# Patient Record
Sex: Female | Born: 1945 | Race: White | Hispanic: No | Marital: Married | Smoking: Never smoker
Health system: Southern US, Community
[De-identification: ages and names within clinical notes are randomized; demographics above are authoritative.]

## PROBLEM LIST (undated history)

## (undated) DIAGNOSIS — M8580 Other specified disorders of bone density and structure, unspecified site: Secondary | ICD-10-CM

## (undated) DIAGNOSIS — F419 Anxiety disorder, unspecified: Secondary | ICD-10-CM

## (undated) DIAGNOSIS — N814 Uterovaginal prolapse, unspecified: Secondary | ICD-10-CM

## (undated) DIAGNOSIS — J309 Allergic rhinitis, unspecified: Secondary | ICD-10-CM

## (undated) DIAGNOSIS — E039 Hypothyroidism, unspecified: Secondary | ICD-10-CM

## (undated) DIAGNOSIS — M549 Dorsalgia, unspecified: Secondary | ICD-10-CM

## (undated) DIAGNOSIS — I10 Essential (primary) hypertension: Secondary | ICD-10-CM

## (undated) DIAGNOSIS — E7800 Pure hypercholesterolemia, unspecified: Secondary | ICD-10-CM

## (undated) DIAGNOSIS — G8929 Other chronic pain: Secondary | ICD-10-CM

## (undated) HISTORY — DX: Allergic rhinitis, unspecified: J30.9

## (undated) HISTORY — DX: Other specified disorders of bone density and structure, unspecified site: M85.80

## (undated) HISTORY — DX: Dorsalgia, unspecified: M54.9

## (undated) HISTORY — DX: Pure hypercholesterolemia, unspecified: E78.00

## (undated) HISTORY — DX: Hypothyroidism, unspecified: E03.9

## (undated) HISTORY — DX: Uterovaginal prolapse, unspecified: N81.4

## (undated) HISTORY — DX: Essential (primary) hypertension: I10

## (undated) HISTORY — DX: Other chronic pain: G89.29

## (undated) HISTORY — DX: Anxiety disorder, unspecified: F41.9

---
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
## 1970-01-24 HISTORY — PX: TUBAL LIGATION: SHX77

## 1997-06-03 ENCOUNTER — Ambulatory Visit: Admission: RE | Admit: 1997-06-03 | Discharge: 1997-06-03 | Payer: Self-pay | Admitting: Obstetrics and Gynecology

## 1997-06-25 ENCOUNTER — Other Ambulatory Visit: Admission: RE | Admit: 1997-06-25 | Discharge: 1997-06-25 | Payer: Self-pay | Admitting: Obstetrics and Gynecology

## 1998-10-09 ENCOUNTER — Other Ambulatory Visit: Admission: RE | Admit: 1998-10-09 | Discharge: 1998-10-09 | Payer: Self-pay | Admitting: Obstetrics and Gynecology

## 1999-01-27 ENCOUNTER — Ambulatory Visit (HOSPITAL_COMMUNITY): Admission: RE | Admit: 1999-01-27 | Discharge: 1999-01-27 | Payer: Self-pay | Admitting: Obstetrics and Gynecology

## 1999-01-27 ENCOUNTER — Encounter (INDEPENDENT_AMBULATORY_CARE_PROVIDER_SITE_OTHER): Payer: Self-pay

## 1999-10-28 ENCOUNTER — Other Ambulatory Visit: Admission: RE | Admit: 1999-10-28 | Discharge: 1999-10-28 | Payer: Self-pay | Admitting: Obstetrics and Gynecology

## 2000-12-08 ENCOUNTER — Other Ambulatory Visit: Admission: RE | Admit: 2000-12-08 | Discharge: 2000-12-08 | Payer: Self-pay | Admitting: Obstetrics and Gynecology

## 2002-02-08 ENCOUNTER — Other Ambulatory Visit: Admission: RE | Admit: 2002-02-08 | Discharge: 2002-02-08 | Payer: Self-pay | Admitting: Obstetrics and Gynecology

## 2002-10-09 ENCOUNTER — Ambulatory Visit (HOSPITAL_COMMUNITY): Admission: RE | Admit: 2002-10-09 | Discharge: 2002-10-09 | Payer: Self-pay | Admitting: *Deleted

## 2003-06-11 ENCOUNTER — Other Ambulatory Visit: Admission: RE | Admit: 2003-06-11 | Discharge: 2003-06-11 | Payer: Self-pay | Admitting: Obstetrics and Gynecology

## 2004-02-27 ENCOUNTER — Ambulatory Visit (HOSPITAL_COMMUNITY): Admission: RE | Admit: 2004-02-27 | Discharge: 2004-02-27 | Payer: Self-pay | Admitting: Internal Medicine

## 2004-09-17 ENCOUNTER — Other Ambulatory Visit: Admission: RE | Admit: 2004-09-17 | Discharge: 2004-09-17 | Payer: Self-pay | Admitting: Obstetrics and Gynecology

## 2005-04-26 ENCOUNTER — Ambulatory Visit (HOSPITAL_COMMUNITY): Admission: RE | Admit: 2005-04-26 | Discharge: 2005-04-26 | Payer: Self-pay | Admitting: Endocrinology

## 2007-05-24 ENCOUNTER — Emergency Department (HOSPITAL_COMMUNITY): Admission: EM | Admit: 2007-05-24 | Discharge: 2007-05-25 | Payer: Self-pay | Admitting: Emergency Medicine

## 2007-08-24 ENCOUNTER — Encounter: Admission: RE | Admit: 2007-08-24 | Discharge: 2007-08-24 | Payer: Self-pay | Admitting: Endocrinology

## 2008-09-01 ENCOUNTER — Encounter: Admission: RE | Admit: 2008-09-01 | Discharge: 2008-09-01 | Payer: Self-pay | Admitting: Internal Medicine

## 2009-06-23 IMAGING — US US SOFT TISSUE HEAD/NECK
1 series · 14 of 23 positions shown · non-contrast
Comparison: [REDACTED] thyroid ultrasounds 02/27/2004 and 04/26/2005.

CLINICAL DATA: Follow-up multinodular goiter.  Synthroid treatment.

THYROID ULTRASOUND
TECHNIQUE: Ultrasound examination of the thyroid gland and all
adjacent soft tissues was performed.

[Series 1: us soft tissue head/neck · 0.06mm/px · 14 of 23 slices shown]
[im 1/23]
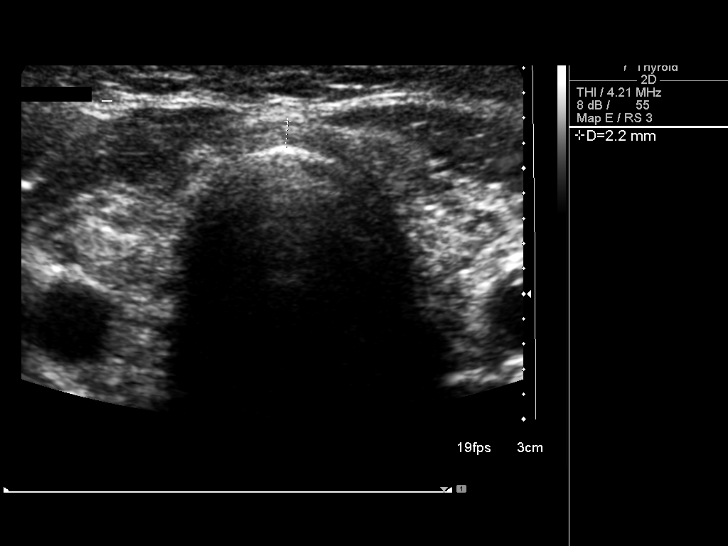
[im 3/23]
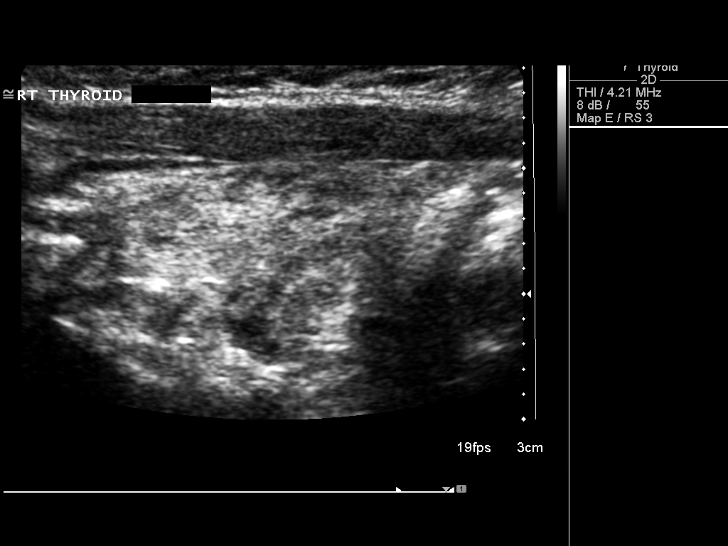
[im 5/23]
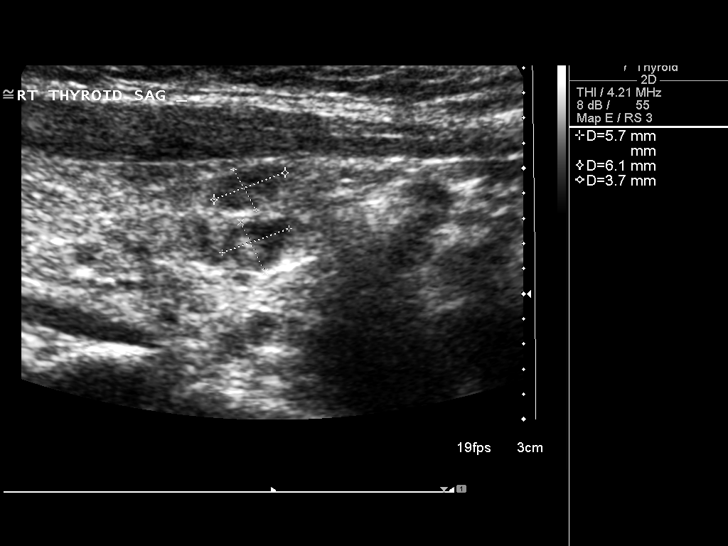
[im 6/23]
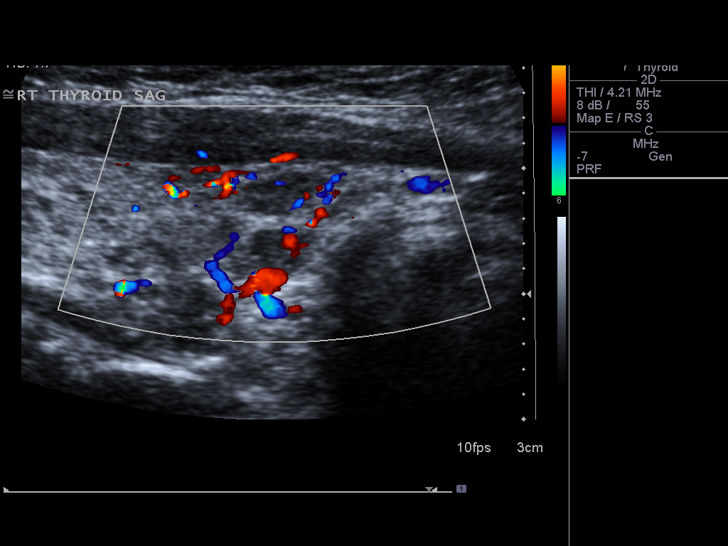
[im 8/23]
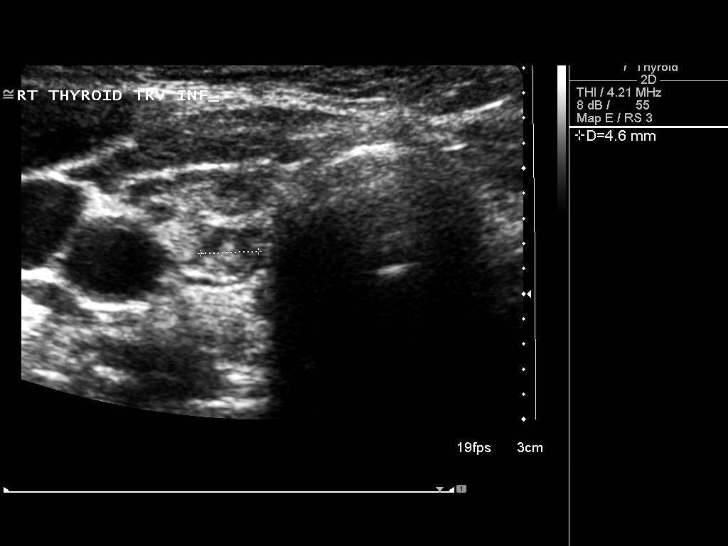
[im 10/23]
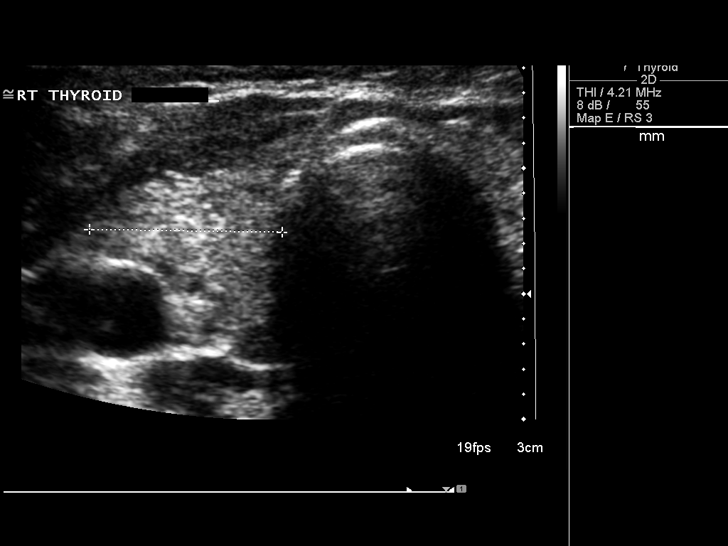
[im 11/23]
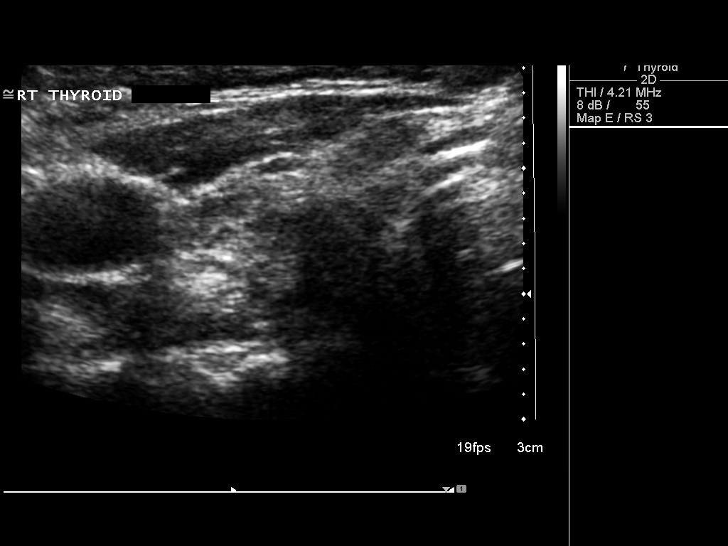
[im 13/23]
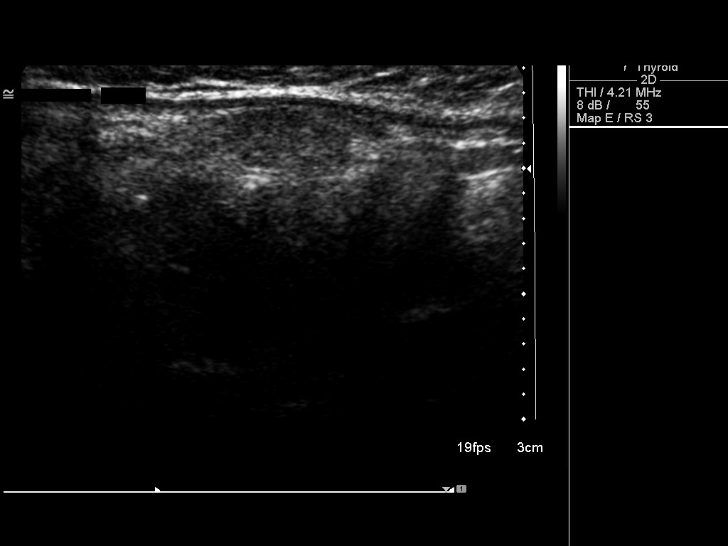
[im 14/23]
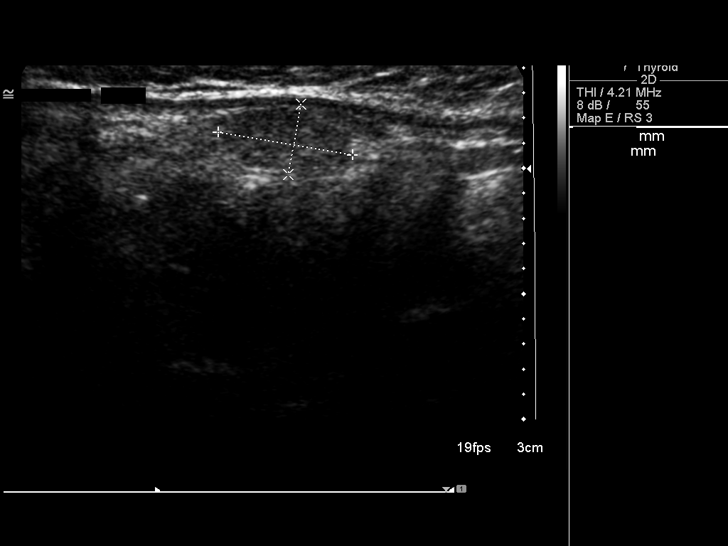
[im 16/23]
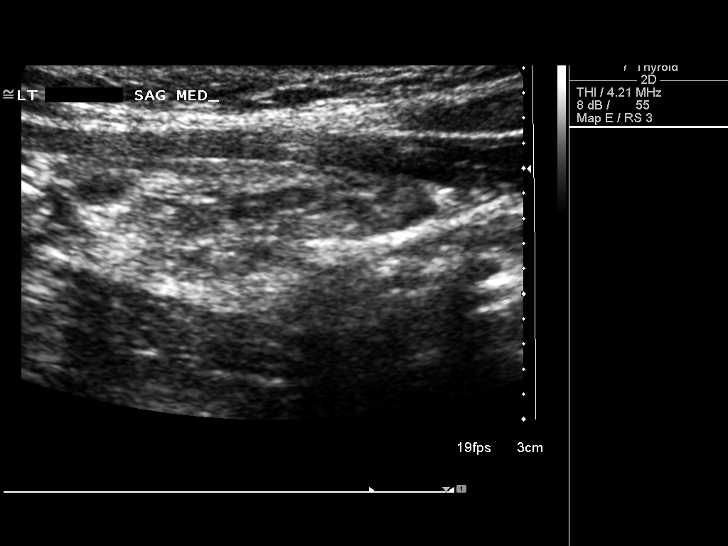
[im 18/23]
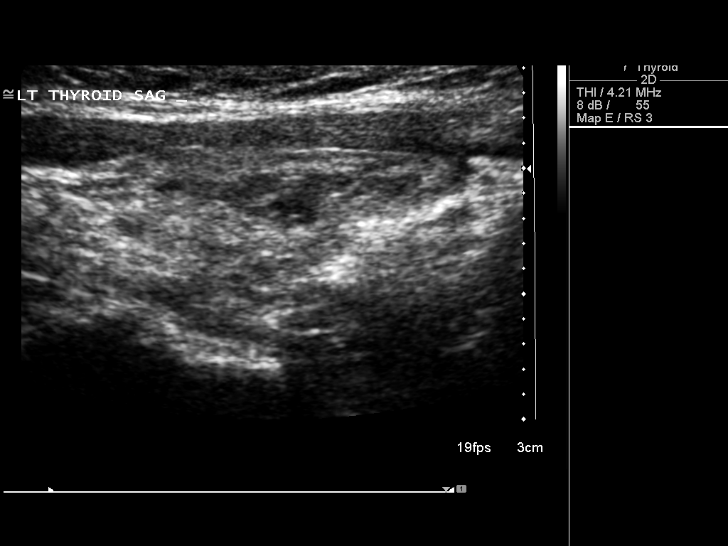
[im 19/23]
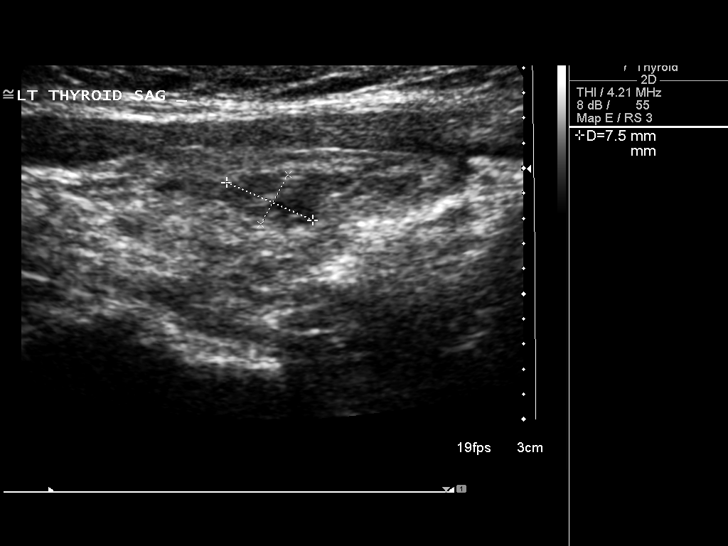
[im 21/23]
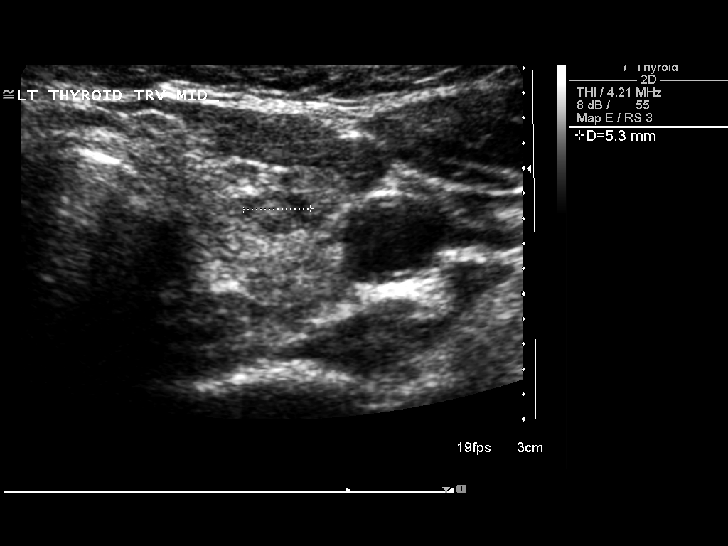
[im 23/23]
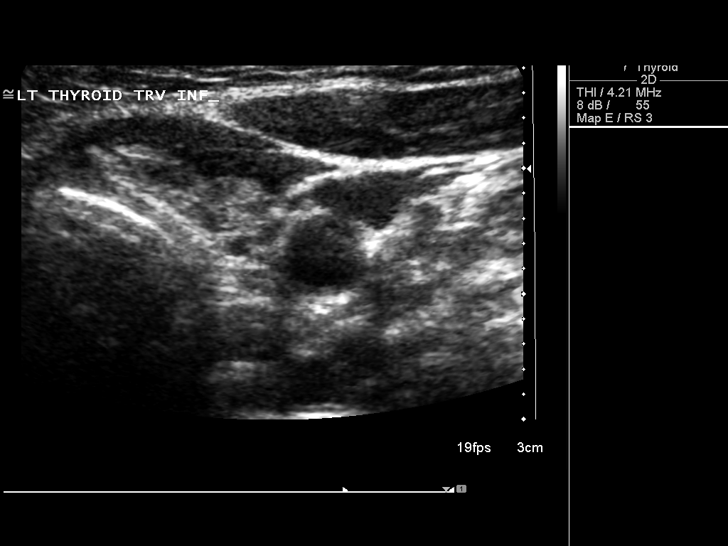

[14 of 23 positions shown; findings below may reference images not displayed]

FINDINGS: Thyroid gland remains stable in size with the right lobe
measuring 4.1 cm long X 1.4 cm AP X 1.5 cm wide (02/27/2004 4.6 X
1.6 but X 1.8 cm and 04/26/2005 4.5 X 1.5 X 1.7 cm).  Left lobe
currently measures 3.5 cm long X 1.3 cm AP X 1.5 cm wide
(02/27/2004 4.0 X 1.7 X 1.6 cm and 04/26/2005 3.6 X 1.5 X 1.3 cm).
Isthmus measures 2 mm with stable diffuse thyroid echotexture
inhomogeneity.  Currently small solid nodules are seen with the
largest at the medial mid right para isthmus nodule measuring
cm long X 0.6 cm AP X 0.5 cm wide (02/27/2004 1.1 x 0.9 cm and
04/26/2005 1.3 X 0.5 X 1 cm).  At the mid to lower pole right
thyroid are two adjacent 6 mm and central left thyroid 7 mm solid
nodules.  No new significant abnormality is seen.
IMPRESSION: Stable findings of slight multinodular goiter.

## 2010-02-13 ENCOUNTER — Other Ambulatory Visit: Payer: Self-pay | Admitting: Endocrinology

## 2010-02-13 DIAGNOSIS — E041 Nontoxic single thyroid nodule: Secondary | ICD-10-CM

## 2010-02-14 ENCOUNTER — Encounter: Payer: Self-pay | Admitting: Endocrinology

## 2010-06-11 NOTE — H&P (Signed)
[facility]  Patient:    Dominique Cordova, Dominique Cordova Visit Number: [id number] MRN: [id number]          Service Type: Attending:  Guy Sandifer. Arleta Creek, M.D. Dictated by:   Guy Sandifer Arleta Creek, M.D. Proc. Date: 03/23/01 Adm. Date:  03/23/01                           History and Physical  CHIEF COMPLAINT:              Abnormal uterine bleeding.  HISTORY OF PRESENT ILLNESS:   This patient is a 65 year old married white female, G2, P2, status post tubal ligation who had her last regular menstrual cycle several years ago.  She has had continued irregular bleeding after attempt at a variety of hormonal manipulations.  She underwent hysteroscopy D&C for benign pathology in 2001.  Repeat ultrasound with sonohistogram was ordered to evaluate enlarged uterus.  Ultrasound on January 05, 2001, revealed the uterus measured 7.3 x 6.8 x 5.7 cm. At least one fibroid measuring 4.4 cm was noted.  This was essentially with ultrasound about one year ago.  Ovaries were normal.  Sonohistogram revealed a 1 cm filling defect consistent with a possible endometrial polyp.  After discussing operative management, the patient is being admitted for hysteroscopy and D&C.  PAST MEDICAL HISTORY:         1. Chronic hypertension.                               2. Hypothyroidism.  MEDICATIONS:                  Synthroid daily, atenolol 25 mg daily, diuretic 1/2 pill daily, Zyrtec p.r.n., Humibid p.r.n.  ALLERGIES:                    Z-PAK leading to nausea.  FAMILY HISTORY:               Diabetes in patients maternal grandmother. Multiple myeloma in the patients mother.  Heart murmur in the patients daughter.  Asthma in the patients brother.  SOCIAL HISTORY:               The patient denies tobacco, alcohol, or drug abuse.  REVIEW OF SYSTEMS:            Negative except as above.  PHYSICAL EXAMINATION:  VITAL SIGNS:                  Height 5 feet 0 inches, weight 131-1/2 pounds. Blood  pressure 124/80.  NECK:                         Without thyromegaly.  LUNGS:                        Clear to auscultation.  HEART:                        Regular rate and rhythm.  BACK:                         Without CVA tenderness.  BREASTS:                      Without mass, retraction, discharge.  ABDOMEN:  Soft, nontender, without masses.  PELVIC:                       Vulva, vagina, cervix without lesion.  Uterus approximately 10 weeks in size, retroverted, nontender, mobile.  Adnexal exam compromised.  RECTAL:                       Without masses.  EXTREMITIES:                  Grossly within normal limits.  NEUROLOGIC:                   Grossly within normal limits.  ASSESSMENT:                   Possible endometrial polyp on sonohistogram.  PLAN:                         Hysteroscopy with D&C. Dictated by:   Guy Sandifer Arleta Creek, M.D. Attending:  Guy Sandifer Arleta Creek, M.D. DD:  03/16/01 TD:  03/16/01 Job: [id number]

## 2010-06-11 NOTE — Op Note (Signed)
   NAME:  Dominique Cordova, Dominique Cordova                        ACCOUNT NO.:  [id number]   MEDICAL RECORD NO.:  [id number]                   PATIENT TYPE:  AMB   LOCATION:  ENDO                                 FACILITY:  [facility]   PHYSICIAN:  Georgiana Spinner, M.D.                 DATE OF BIRTH:  04/27/1945   DATE OF PROCEDURE:  DATE OF DISCHARGE:                                 OPERATIVE REPORT   PROCEDURE:  Colonoscopy.   INDICATIONS:  Colon cancer screening.   ANESTHESIA:  Demerol 50 mg, Versed 4.   DESCRIPTION OF PROCEDURE:  With the patient mildly sedated and in the left  lateral decubitus position, the Olympus videoscopic colonoscope was inserted  in the rectum and passed under direct vision to the cecum, identified by  ileocecal valve and appendiceal orifice, both of which were photographed.  From this point,  the colonoscope was slowly withdrawn, taking  circumferential views of the colonic mucosa visualized stopping only in the  rectum which appeared normal on direct and showed hemorrhoids on retroflexed  view.  The endoscope was straightened and withdrawn.  The patient's vital  signs and pulse oximetry remained stable.  The patient tolerated the  procedure well without apparent complications.   FINDINGS:  Internal hemorrhoids; otherwise unremarkable colonoscopic  examination to the cecum.   PLAN:  Repeat examination possibly in 10 years.                                               Georgiana Spinner, M.D.    GMO/MEDQ  D:  10/09/2002  T:  10/09/2002  Job:  [id number]

## 2010-06-11 NOTE — Op Note (Signed)
[facility]  Patient:    Dominique Cordova                    MRN: [id number] Proc. Date: 01/27/99 Adm. Date:  [id number] Attending:  Madelyn Flavors CC:         Beather Arbour. Thomasena Edis, M.D.             Soyla Murphy. Renne Crigler, M.D.                           Operative Report  PREOPERATIVE DIAGNOSIS:  Oligomenorrhea, endometrial polyps.  POSTOPERATIVE DIAGNOSIS:  Oligomenorrhea, endometrial polyps.  OPERATION:  Dilatation and curettage, hysteroscopy, removal of endometrial polyps.  SURGEON:  Beather Arbour. Thomasena Edis, M.D.  ANESTHESIA:  MAC plus 10 cc 1% lidocaine paracervical block.  FLUID REPLACEMENT:  Approximately 1000 cc of crystalloid.  ESTIMATED BLOOD LOSS:  Minimal.  COMPLICATIONS:  None.  DESCRIPTION OF PROCEDURE:  The patient was brought to the operating room and identified on the operating room table.  After the induction of adequate MAC analgesia, the patient was placed in the dorsal lithotomy position and prepped nd draped in the usual sterile fashion.  Examination under anesthesia after the bladder was emptied revealed the uterus to be posterior without any adnexal mass palpated.  Lidocaine 1% 1 cc was infused into the posterior lip of the cervix which was grasped with a single-toothed tenaculum.  The remaining 9 cc were infused for paracervical block.  The cervix was very gently dilated up to a #23 Pratt dilator. The cervix did not allow dilatation beyond this point.  Interestingly, the cervical canal tracked posteriorly, then anteriorly and to the right.  The hysteroscope as placed and a careful and thorough hysteroscopic examination was performed.  The  polyps were identified on the posterior wall of the uterus.  Tubal ostia appeared normal.  The remainder of the endometrium was totally normal.  The endometrium as then sharply curetted in a systematic clockwise fashion with polyps and tissue obtained.  The Randall stone forceps were  placed and additional tissue was obtained.  At that point, the hysteroscope was again placed and the polyps were  noted to have been removed in their entirety.  There was a small amount of endometrial tissue which remained and this was curetted subsequently.  At that point the procedure was then terminated after noting that all polyps were removed. The patient tolerated the procedure well without apparent complications and was  transferred to the recovery room in stable condition after all instrument, sponge, needle counts were correct.  The patient is urged to place nothing in her vagina for two weeks, to return in two to three weeks for postoperative evaluation. She was instructed that she could take ibuprofen 400 to 600 mg p.o. q.6h. as needed for pain and to call for any problems.  She is to call for heavy bleeding and temperature greater than 100 degrees or any other problem. DD:  01/27/99 TD:  01/27/99 Job: [id number]

## 2010-08-30 ENCOUNTER — Other Ambulatory Visit: Payer: Self-pay

## 2011-07-18 ENCOUNTER — Other Ambulatory Visit: Payer: Self-pay | Admitting: Internal Medicine

## 2011-07-18 DIAGNOSIS — I6529 Occlusion and stenosis of unspecified carotid artery: Secondary | ICD-10-CM

## 2011-10-25 DIAGNOSIS — E039 Hypothyroidism, unspecified: Secondary | ICD-10-CM

## 2011-10-25 HISTORY — DX: Hypothyroidism, unspecified: E03.9

## 2011-10-31 ENCOUNTER — Ambulatory Visit
Admission: RE | Admit: 2011-10-31 | Discharge: 2011-10-31 | Disposition: A | Discharge status: Home / Self Care | Payer: 59 | Source: Ambulatory Visit | Attending: Internal Medicine | Admitting: Internal Medicine

## 2011-10-31 ENCOUNTER — Other Ambulatory Visit: Payer: Self-pay

## 2011-10-31 DIAGNOSIS — I6529 Occlusion and stenosis of unspecified carotid artery: Secondary | ICD-10-CM

## 2011-12-25 HISTORY — PX: COLONOSCOPY: SHX174

## 2013-01-10 ENCOUNTER — Telehealth: Payer: Self-pay | Admitting: *Deleted

## 2013-01-10 ENCOUNTER — Encounter: Payer: Self-pay | Admitting: Cardiology

## 2013-01-10 ENCOUNTER — Ambulatory Visit (INDEPENDENT_AMBULATORY_CARE_PROVIDER_SITE_OTHER): Payer: 59 | Admitting: Cardiology

## 2013-01-10 VITALS — BP 126/68 | HR 92 | Ht 59.5 in | Wt 132.3 lb

## 2013-01-10 DIAGNOSIS — E7800 Pure hypercholesterolemia, unspecified: Secondary | ICD-10-CM

## 2013-01-10 DIAGNOSIS — R0789 Other chest pain: Secondary | ICD-10-CM

## 2013-01-10 DIAGNOSIS — I10 Essential (primary) hypertension: Secondary | ICD-10-CM

## 2013-01-10 DIAGNOSIS — R0602 Shortness of breath: Secondary | ICD-10-CM

## 2013-01-10 NOTE — Patient Instructions (Addendum)
Your physician recommends that you schedule a follow-up appointment in:  After Test  Your Physician had ordered you to have a CPET TEST

## 2013-01-10 NOTE — Telephone Encounter (Signed)
Pt called bc she has an appointment with Dr. Herbie Baltimore at 3 and she stated that she has diarrhea and no fever but wanted to know if she should come in or reschedule her appointment.

## 2013-01-10 NOTE — Progress Notes (Signed)
PATIENT: Dominique Cordova MRN: [id number] DOB: Feb 02, 1945 PCP: Londell Moh, MD  Clinic Note: Chief Complaint  Patient presents with  . Annual Exam    saw Dr Renne Crigler 's extender last Monday - left arm discomfort  no radiation , (HAVE BEEN LIFTING AND PULLING),no sob , no edema, --since yesterday had little diarrhea     HPI: Dominique Cordova is a 67 y.o. female with a PMH below (notably hypertension and hyperlipidemia) who presents today for evaluation of left arm/shoulder discomfort. She was seen at [facility] and referred for cardiac evaluation.  Interval History: Care and presents here mostly with symptoms of note an aching type of pain that goes down along her left arm into her shoulder. It also involves the upper left chest. There is no substernal discomfort or jaw pain. That discomfort is present at rest, and is present currently. It is not exacerbated with exertion unless she is using that arm. She states that she has had positive stress of late  With an 55- year-old granddaughter being in the hospital for severe eating disorder. She also notices he's been doing a lot of heavy lifting and pulling using her arms. She states that the arm pain is improved with ibuprofen  She does note intermittent discomfort in her chest both at rest and with exertion that is fleeting. She denies any exertional dyspnea unless she overexerts. She denies any heart failure symptoms of PND, orthopnea or edema. She does note mild fatigue, but this is more limited to myalgias and back discomfort.  She notes that she can walk on a treadmill but would not deal with the discomfort for more than a few minutes.  She has tried using stationary bicycles and is also uncomfortable for her. She thinks the fatigue is probably more due to deconditioning.   The remainder of cardiac review of systems is as follows: Cardiovascular ROS: negative for - edema, irregular heartbeat, loss of consciousness, murmur,  orthopnea, palpitations, paroxysmal nocturnal dyspnea, rapid heart rate or shortness of breath She says she has episodes of mild anxiety where she'll feel some palpitations but mostly that she feels shaky and lightheaded with tightness in the chest. These symptoms actually improved with ambulation. She doesn't have edema but does note that her ankles are somewhat puffy. Additional cardiac review of systems: syncope/near-syncope - no; TIA/amaurosis fugax - no Melena - no, hematochezia no; hematuria - no; nosebleeds - no; claudication - no  Past Medical History  Diagnosis Date  . Hypertension     On losartan/HCTZ  . Hypothyroidism  October 2013    Thyroid ultrasound: Goiter noted. No discrete nodules  . Hypercholesterolemia      currently taking Livalo, myalgias with Crestor  . Chronic allergic rhinitis   . Osteopenia   . Mild anxiety   . Uterine prolapse   . Chronic back pain     Prior Cardiac Evaluation and Past Surgical History: Past Surgical History  Procedure Laterality Date  . Colonoscopy  December 2013    Negative  . Tubal ligation  1972  . Orif femur fracture  1966    After broken femur    No Known Allergies -- possible myalgias with statins.  Current Outpatient Prescriptions  Medication Sig Dispense Refill  . aspirin EC 81 MG tablet Take 81 mg by mouth daily.      . cyclobenzaprine (FLEXERIL) 10 MG tablet Take 10 mg by mouth 3 (three) times daily as needed for muscle spasms.      Marland Kitchen  hydrOXYzine (VISTARIL) 25 MG capsule Take 25 mg by mouth 3 (three) times daily as needed.      Marland Kitchen ibuprofen (ADVIL) 200 MG tablet Take 200 mg by mouth every 6 (six) hours as needed.      Marland Kitchen levothyroxine (SYNTHROID, LEVOTHROID) 88 MCG tablet Take 88 mcg by mouth daily before breakfast.      . losartan-hydrochlorothiazide (HYZAAR) 100-25 MG per tablet TAKE 1/2 TABLET BY MOUTH DAILY      . Pitavastatin Calcium (LIVALO) 2 MG TABS Take by mouth. TAKE 1/2 TABLET BY MOUTH DAILY       No current  facility-administered medications for this visit.    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 She completed 2 years of college. Does not get routine exercise. Does not smoke cigarettes or drink alcohol.   They have 2 daughters and 2 grandchildren.   Family History: family history includes Eating disorder in her grandchild; Healthy in her brother; Heart failure in her father; Lung cancer in her father; Multiple myeloma in her mother.  ROS: A comprehensive Review of Systems - Negative except Symptoms noted in history of present illness. Chronic myalgias and back pain.  She says that she is a diet and trying to do some exercise. With this she notes about a 15 pound weight loss over the summer.  PHYSICAL EXAM BP 126/68  Pulse 92  Ht 4' 11.5" (1.511 m)  Wt 132 lb 4.8 oz (60.011 kg)  BMI 26.28 kg/m2 General appearance: Alert and oriented x3, well-nourished and well-groomed. Somewhat shy and reserved but anxious appearing. Answered questions apparently. HEENT: [place]/AT, EOMI, MMM, anicteric sclera eck: no adenopathy, no carotid bruit, no JVD, supple, symmetrical, trachea midline and thyroid not enlarged, symmetric, no tenderness/mass/nodules Lungs: clear to auscultation bilaterally, normal percussion bilaterally and  nonlabored, good air movement Heart: regular rate and rhythm, S1, S2 normal, no murmur, click, rub or gallop and normal apical impulse Abdomen: soft, non-tender; bowel sounds normal; no masses,  no organomegaly Extremities: extremities normal, atraumatic, no cyanosis or edema and The left upper arm is somewhat sore to touch. There are several areas along the rotator cuff are also tender to palpation with point tenderness. Pulses: 2+ and symmetric Skin: Skin color, texture, turgor normal. No rashes or lesions Neurologic: Alert and oriented X 3, normal strength and tone. Normal symmetric reflexes. Normal coordination and gait  [id number]  today: NO - -ECG from PCP office reviewed. Rate: 75, Rhythm:  NSR; baseline artifact but otherwise normal ECG;  Recent Labs: reviewed from PCP 01/08/2013, will be scanned in Epic notable labs as follows:    TC 191, HDL 47, LDL 114, TG 151; CK 68  CBC normal, LFTs normal, chemistry panel essentially normal with creatinine 0.64  ASSESSMENT / PLAN:  Atypical chest pain The discomfort she is feeling is probably related to a minimal left arm, there is some tenderness in the rotator cuff as well. The does not appear to be any radiation from the chest to the arm. Myelination is a this is musculoskeletal in nature and not cardiac in nature. However she does have exertional dyspnea and cardiac risk factors. I'm concerned about her ability to perform any type of exercise stress test; however, she tells me that she thinks that she could ride a bicycle.  Plan: CPET-MET test to evaluate baseline cardiovascular risk  SOB (shortness of breath) Most likely related to deconditioning and myalgias. However with hypertension and hyperlipidemia as cardiac  risk factors, we will test her baseline cardiovascular risk with CPET-MET testing.  Hypercholesterolemia Currently taking Livalo. Monitored by PCP. For her current risk stratification level LDL of 114 is relatively at goal. The normal CK he would tell me that last sinus related to statin therapy.  Hypertension Well controlled on current regimen.    Orders Placed This Encounter  Procedures  . CPET ONLY (MET TEST)    Standing Status: Future     Number of Occurrences:      Standing Expiration Date: 01/10/2014    Followup:  1  month  Jahkai Yandell W. Herbie Baltimore, M.D., M.S. [facility] [address] [place], [place]  [address]  [phone] Pager # [phone]

## 2013-01-10 NOTE — Telephone Encounter (Signed)
Dominique December, RN notified and advised if pt thinks she is contagious, then she should reschedule.  Returned call and pt verified x 2.  Pt with c/o diarrhea (loose yesterday, watery today).  Stated she was weak yesterday, but feels better today.  Pt denied having fever.  Pt informed she can reschedule appt if she feels symptoms are contagious, like frequent diarrhea w/ fever.  RN asked pt what the reason for visit is and pt stated for L arm pain.  Denied CP and c/o "muscle pain" in her chest.  Stated Dr. Carolee Rota office did labs and were supposed to fax all of that information to our office.  Pt c/o one watery stool today.  Pt informed she can keep appt if no more stools and no fever.  If symptoms change, she should call back to reschedule.  Pt verbalized understanding and agreed w/ plan.

## 2013-01-12 ENCOUNTER — Encounter: Payer: Self-pay | Admitting: Cardiology

## 2013-01-12 DIAGNOSIS — R0789 Other chest pain: Secondary | ICD-10-CM | POA: Insufficient documentation

## 2013-01-12 DIAGNOSIS — E7800 Pure hypercholesterolemia, unspecified: Secondary | ICD-10-CM | POA: Insufficient documentation

## 2013-01-12 DIAGNOSIS — I10 Essential (primary) hypertension: Secondary | ICD-10-CM | POA: Insufficient documentation

## 2013-01-12 DIAGNOSIS — R0602 Shortness of breath: Secondary | ICD-10-CM | POA: Insufficient documentation

## 2013-01-12 NOTE — Assessment & Plan Note (Signed)
Currently taking Livalo. Monitored by PCP. For her current risk stratification level LDL of 114 is relatively at goal. The normal CK he would tell me that last sinus related to statin therapy.

## 2013-01-12 NOTE — Assessment & Plan Note (Signed)
Most likely related to deconditioning and myalgias. However with hypertension and hyperlipidemia as cardiac risk factors, we will test her baseline cardiovascular risk with CPET-MET testing.

## 2013-01-12 NOTE — Assessment & Plan Note (Signed)
Well-controlled on current regimen. ?

## 2013-01-12 NOTE — Assessment & Plan Note (Signed)
The discomfort she is feeling is probably related to a minimal left arm, there is some tenderness in the rotator cuff as well. The does not appear to be any radiation from the chest to the arm. Myelination is a this is musculoskeletal in nature and not cardiac in nature. However she does have exertional dyspnea and cardiac risk factors. I'm concerned about her ability to perform any type of exercise stress test; however, she tells me that she thinks that she could ride a bicycle.  Plan: CPET-MET test to evaluate baseline cardiovascular risk

## 2013-01-14 ENCOUNTER — Encounter: Payer: Self-pay | Admitting: Cardiology

## 2013-02-04 ENCOUNTER — Ambulatory Visit: Payer: 59 | Admitting: Cardiology

## 2016-03-01 ENCOUNTER — Other Ambulatory Visit: Payer: Self-pay | Admitting: Internal Medicine

## 2016-03-01 DIAGNOSIS — I6523 Occlusion and stenosis of bilateral carotid arteries: Secondary | ICD-10-CM

## 2019-02-15 ENCOUNTER — Ambulatory Visit: Payer: Self-pay | Attending: Internal Medicine

## 2019-02-15 DIAGNOSIS — Z23 Encounter for immunization: Secondary | ICD-10-CM | POA: Insufficient documentation

## 2019-02-15 NOTE — Progress Notes (Signed)
   Covid-19 Vaccination Clinic  Name:  Dominique Cordova    MRN: [id number] DOB: 1945-11-19  02/15/2019  [name] was observed post Covid-19 immunization for 15 minutes without incidence. She was provided with Vaccine Information Sheet and instruction to access the V-Safe system.   [name] was instructed to call 911 with any severe reactions post vaccine: Marland Kitchen Difficulty breathing  . Swelling of your face and throat  . A fast heartbeat  . A bad rash all over your body  . Dizziness and weakness    Immunizations Administered    Name Date Dose VIS Date Route   Pfizer COVID-19 Vaccine 02/15/2019  4:16 PM 0.3 mL 01/04/2019 Intramuscular   Manufacturer: [employer], [employer]   Lot: [id number]   NDC: [address]-9

## 2019-03-08 ENCOUNTER — Ambulatory Visit: Payer: Self-pay | Attending: Internal Medicine

## 2019-03-08 DIAGNOSIS — Z23 Encounter for immunization: Secondary | ICD-10-CM | POA: Insufficient documentation

## 2019-03-08 NOTE — Progress Notes (Signed)
   Covid-19 Vaccination Clinic  Name:  Dominique Cordova    MRN: [id number] DOB: 1945/08/18  03/08/2019  Dominique Cordova was observed post Covid-19 immunization for 15 minutes without incidence. She was provided with Vaccine Information Sheet and instruction to access the V-Safe system.   Dominique Cordova was instructed to call 911 with any severe reactions post vaccine: Marland Kitchen Difficulty breathing  . Swelling of your face and throat  . A fast heartbeat  . A bad rash all over your body  . Dizziness and weakness    Immunizations Administered    Name Date Dose VIS Date Route   Pfizer COVID-19 Vaccine 03/08/2019  3:22 PM 0.3 mL 01/04/2019 Intramuscular   Manufacturer: [employer], [employer]   Lot: [id number]   NDC: [address]-5

## 2021-02-16 ENCOUNTER — Other Ambulatory Visit: Payer: Self-pay

## 2021-02-16 ENCOUNTER — Ambulatory Visit
Admission: EM | Admit: 2021-02-16 | Discharge: 2021-02-16 | Disposition: A | Discharge status: Home / Self Care | Payer: POS

## 2021-02-16 ENCOUNTER — Encounter: Payer: Self-pay | Admitting: Emergency Medicine

## 2021-02-16 DIAGNOSIS — I10 Essential (primary) hypertension: Secondary | ICD-10-CM

## 2021-02-16 DIAGNOSIS — R9431 Abnormal electrocardiogram [ECG] [EKG]: Secondary | ICD-10-CM | POA: Diagnosis not present

## 2021-02-16 DIAGNOSIS — I160 Hypertensive urgency: Secondary | ICD-10-CM

## 2021-02-16 NOTE — Discharge Instructions (Addendum)
Your blood pressure is high today at 202/98; recheck 177/99.  Please have this rechecked by your primary care provider tomorrow.      Call 911 and go to the emergency department if you have weakness, chest pain, shortness of breath, or other concerning symptoms.

## 2021-02-16 NOTE — ED Triage Notes (Signed)
Pt states she took her BP today and it was elevated. She went to the fire department to see if it her BP cuff was working and it was 200's/100's. Pt denies any symptoms.

## 2021-02-16 NOTE — ED Provider Notes (Signed)
[facility] Dominique Cordova    CSN: [id number] Arrival date & time: 02/16/21  1801      History   Chief Complaint Chief Complaint  Patient presents with   Hypertension    HPI Dominique Cordova is a 76 y.o. female.  Accompanied by her husband, patient presents with elevated blood pressure reading at home today.  She felt "fuzzy headed" and had sinus pressure; Her blood pressure was 200/100.  She had it checked at the fire department and it was similar.  She denies focal weakness, dizziness, headache, chest pain, shortness of breath, or other symptoms.  Her medical history includes hypertension, hypothyroidism, anxiety, chronic back pain, chronic allergic rhinitis.  The history is provided by the patient, the spouse and medical records.   Past Medical History:  Diagnosis Date   Chronic allergic rhinitis    Chronic back pain    Hypercholesterolemia     currently taking Livalo, myalgias with Crestor   Hypertension    On losartan/HCTZ   Hypothyroidism  October 2013   Thyroid ultrasound: Goiter noted. No discrete nodules   Mild anxiety    Osteopenia    Uterine prolapse     Patient Active Problem List   Diagnosis Date Noted   Atypical chest pain 01/12/2013   SOB (shortness of breath) 01/12/2013   Hypertension    Hypercholesterolemia     Past Surgical History:  Procedure Laterality Date   COLONOSCOPY  December 2013   Negative   ORIF FEMUR FRACTURE  1966   After broken femur   TUBAL LIGATION  1972    OB History   No obstetric history on file.      Home Medications    Prior to Admission medications   Medication Sig Start Date End Date Taking? Authorizing Provider  aspirin EC 81 MG tablet Take 81 mg by mouth daily.    [provider]  cyclobenzaprine (FLEXERIL) 10 MG tablet Take 10 mg by mouth 3 (three) times daily as needed for muscle spasms.    [provider]  ezetimibe (ZETIA) 10 MG tablet Take 10 mg by mouth daily. 01/17/21   [provider]  hydrOXYzine (VISTARIL) 25 MG capsule Take 25 mg by mouth 3 (three) times daily as needed.    [provider]  ibuprofen (ADVIL) 200 MG tablet Take 200 mg by mouth every 6 (six) hours as needed.    [provider]  irbesartan-hydrochlorothiazide (AVALIDE) 300-12.5 MG tablet Take 0.5 tablets by mouth daily. 01/13/21   [provider]  levothyroxine (SYNTHROID, LEVOTHROID) 88 MCG tablet Take 88 mcg by mouth daily before breakfast.    [provider]  loratadine (CLARITIN) 10 MG tablet 1 tablet as needed    [provider]  Magnesium 250 MG TABS 2 tablets    [provider]  Pitavastatin Calcium (LIVALO) 2 MG TABS Take by mouth. TAKE 1/2 TABLET BY MOUTH DAILY    [provider]    Family History Family History  Problem Relation Age of Onset   Multiple myeloma Mother    Lung cancer Father    Eating disorder Grandchild    Heart failure Father    Healthy Brother        3 brothers    Social History Social History   Tobacco Use   Smoking status: Never   Smokeless tobacco: Never  Vaping Use   Vaping Use: Never used  Substance Use Topics   Alcohol use: No   Drug use: No  Allergies   Epinephrine and Levocetirizine   Review of Systems Review of Systems  Constitutional:  Negative for chills and fever.  Respiratory:  Negative for cough and shortness of breath.   Cardiovascular:  Negative for chest pain, palpitations and leg swelling.  Gastrointestinal:  Negative for abdominal pain, nausea and vomiting.  Skin:  Negative for color change and rash.  Neurological:  Negative for dizziness, seizures, syncope, facial asymmetry, speech difficulty, weakness, light-headedness, numbness and headaches.  All other systems reviewed and are negative.   Physical Exam Triage Vital Signs ED Triage Vitals  Enc Vitals Group     BP      Pulse      Resp      Temp      Temp src      SpO2      Weight      Height      Head  Circumference      Peak Flow      Pain Score      Pain Loc      Pain Edu?      Excl. in [place]?    No data found.  Updated Vital Signs BP (!) 177/99 (BP Location: Left Arm)    Pulse (!) 107    Temp 98.9 F (37.2 C) (Oral)    Resp 18    SpO2 97%   Visual Acuity Right Eye Distance:   Left Eye Distance:   Bilateral Distance:    Right Eye Near:   Left Eye Near:    Bilateral Near:     Physical Exam Vitals and nursing note reviewed.  Constitutional:      General: She is not in acute distress.    Appearance: Normal appearance. She is well-developed. She is not ill-appearing.  HENT:     Right Ear: Tympanic membrane normal.     Left Ear: Tympanic membrane normal.     Nose: Nose normal.     Mouth/Throat:     Mouth: Mucous membranes are moist.     Pharynx: Oropharynx is clear.  Cardiovascular:     Rate and Rhythm: Normal rate and regular rhythm.     Heart sounds: Normal heart sounds.  Pulmonary:     Effort: Pulmonary effort is normal. No respiratory distress.     Breath sounds: Normal breath sounds.  Abdominal:     Palpations: Abdomen is soft.     Tenderness: There is no abdominal tenderness.  Musculoskeletal:     Cervical back: Neck supple.     Right lower leg: No edema.     Left lower leg: No edema.  Skin:    General: Skin is warm and dry.  Neurological:     General: No focal deficit present.     Mental Status: She is alert and oriented to person, place, and time.     Sensory: No sensory deficit.     Motor: No weakness.     Gait: Gait normal.  Psychiatric:        Mood and Affect: Mood normal.        Behavior: Behavior normal.     UC Treatments / Results  Labs (all labs ordered are listed, but only abnormal results are displayed) Labs Reviewed - No data to display  EKG   Radiology No results found.  Procedures Procedures (including critical care time)  Medications Ordered in UC Medications - No data to display  Initial Impression / Assessment and Plan /  UC Course  I  have reviewed the triage vital signs and the nursing notes.  Pertinent labs & imaging results that were available during my care of the patient were reviewed by me and considered in my medical decision making (see chart for details).    Elevated blood pressure reading with hypertension, abnormal EKG.  Patient is well-appearing and her exam is reassuring.  She reports she is asymptomatic other than sinus pressure which has been ongoing for several weeks.  EKG shows sinus tachycardia, rate 101, no ST elevation, inverted T wave in V2 which is changed from previous EKG in 2014 which is last available in her chart. Patient and her husband decline transfer to the ED.  Discussed limitations of evaluation in urgent care setting.  Strict 911 and ED precautions discussed.  Instructed patient to be seen by her PCP tomorrow for recheck of her blood pressure and review of her EKG.  She and her husband agree to plan of care.    Final Clinical Impressions(s) / UC Diagnoses   Final diagnoses:  Elevated blood pressure reading in office with diagnosis of hypertension  Abnormal EKG     Discharge Instructions      Your blood pressure is high today at 202/98; recheck 177/99.  Please have this rechecked by your primary care provider tomorrow.      Call 911 and go to the emergency department if you have weakness, chest pain, shortness of breath, or other concerning symptoms.          ED Prescriptions   None    PDMP not reviewed this encounter.   Sharion Balloon, NP 02/16/21 [phone]

## 2021-02-17 ENCOUNTER — Other Ambulatory Visit: Payer: Self-pay | Admitting: Registered Nurse

## 2021-02-17 DIAGNOSIS — I6523 Occlusion and stenosis of bilateral carotid arteries: Secondary | ICD-10-CM

## 2021-02-26 ENCOUNTER — Other Ambulatory Visit: Payer: Self-pay | Admitting: Registered Nurse

## 2021-02-26 DIAGNOSIS — I6523 Occlusion and stenosis of bilateral carotid arteries: Secondary | ICD-10-CM

## 2021-03-17 ENCOUNTER — Ambulatory Visit
Admission: RE | Admit: 2021-03-17 | Discharge: 2021-03-17 | Disposition: A | Discharge status: Home / Self Care | Payer: No Typology Code available for payment source | Source: Ambulatory Visit | Attending: Registered Nurse | Admitting: Registered Nurse

## 2021-03-17 ENCOUNTER — Ambulatory Visit
Admission: RE | Admit: 2021-03-17 | Discharge: 2021-03-17 | Disposition: A | Discharge status: Home / Self Care | Payer: POS | Source: Ambulatory Visit | Attending: Registered Nurse | Admitting: Registered Nurse

## 2021-03-17 DIAGNOSIS — I6523 Occlusion and stenosis of bilateral carotid arteries: Secondary | ICD-10-CM

## 2021-04-18 DIAGNOSIS — R931 Abnormal findings on diagnostic imaging of heart and coronary circulation: Secondary | ICD-10-CM | POA: Insufficient documentation

## 2021-04-18 NOTE — Progress Notes (Signed)
?  ?Cardiology Office Note ? ? ?Date:  04/19/2021  ? ?ID:  Dominique Cordova, DOB Apr 06, 1945, MRN [id number] ? ?PCP:  Dominique Pretty, MD  ?Cardiologist:   None ?Referring:  Dominique Pretty, MD ? ? ?Chief Complaint  ?Patient presents with  ? Elevated coronary calcium  ? ? ?  ?History of Present Illness: ?Dominique Cordova is a 76 y.o. female who presents for evaluation of elevated coronary calcium  She was seen by cardiology in 2014.  She had chest pain.   However, she did not have any further testing as far as I can tell her as far she recalls..  She had a coronary calcium score of 240 which was 75th percentile.    She had atherosclerosis of her aorta and she had tiny pulmonary nodules.  She said that she had this done because of her blood pressure and other cardiovascular risk factors.  She is very vague in the description of symptoms but she says she just does not feel good sometimes.  Her blood pressure has been labile.  She took this 1 day at home and it was elevated.  She went to the fire station and apparently was elevated.  She states she went to an urgent care in [place] and blood pressure was above 200.  She has since had her ARB dose doubled.  Her blood pressures look to be relatively well controlled.  She did have a calcium. ? ?She does not really report chest pressure, neck or arm discomfort.  She does do some stretching exercises but she has not apparently gotten back into other exercising.  She is not describing any new shortness of breath, PND or orthopnea.  She is not describing any palpitations, presyncope or syncope.  She does have anxiety and she never started Lexapro as was prescribed.  She had questions about it today. ? ? ? ? ?Past Medical History:  ?Diagnosis Date  ? Chronic allergic rhinitis   ? Chronic back pain   ? Hypercholesterolemia   ?  currently taking Livalo, myalgias with Crestor  ? Hypertension   ? Hypothyroidism 10/25/2011  ? Thyroid ultrasound: Goiter noted. No discrete nodules  ?  Mild anxiety   ? Osteopenia   ? Uterine prolapse   ? ? ?Past Surgical History:  ?Procedure Laterality Date  ? COLONOSCOPY  December 2013  ? Negative  ? ORIF FEMUR FRACTURE  1966  ? After broken femur  ? TUBAL LIGATION  1972  ? ? ? ?Current Outpatient Medications  ?Medication Sig Dispense Refill  ? acetaminophen (TYLENOL) 500 MG tablet Take 500 mg by mouth every 6 (six) hours as needed.    ? aspirin EC 81 MG tablet Take 81 mg by mouth daily.    ? Calcium Carbonate-Vitamin D (CALCIUM-VITAMIN D3 PO) Take 1 capsule by mouth in the morning and at bedtime.    ? Cholecalciferol (VITAMIN D3 PO) Take 1,500 mg by mouth daily.    ? ezetimibe (ZETIA) 10 MG tablet Take 10 mg by mouth daily.    ? fluticasone (VERAMYST) 27.5 MCG/SPRAY nasal spray Place 2 sprays into the nose as needed for rhinitis.    ? ibuprofen (ADVIL) 200 MG tablet Take 200 mg by mouth every 6 (six) hours as needed.    ? irbesartan-hydrochlorothiazide (AVALIDE) 300-12.5 MG tablet Take 1 tablet by mouth daily.    ? levothyroxine (SYNTHROID) 75 MCG tablet Take 75 mcg by mouth daily before breakfast.    ? loratadine (CLARITIN) 10 MG tablet 1  tablet as needed    ? Pitavastatin Calcium 2 MG TABS Take by mouth. TAKE 1/2 TABLET BY MOUTH DAILY    ? ?No current facility-administered medications for this visit.  ? ? ?Allergies:   Epinephrine and Levocetirizine  ? ? ?Social History:  The patient  reports that she has never smoked. She has never used smokeless tobacco. She reports that she does not drink alcohol and does not use drugs.  ? ?Family History:  The patient's family history includes Eating disorder in her grandchild; Healthy in her brother; Heart failure in her father; Lung cancer in her father; Multiple myeloma in her mother.  ? ? ?ROS:  Please see the history of present illness.   Otherwise, review of systems are positive for none.   All other systems are reviewed and negative.  ? ? ?PHYSICAL EXAM: ?VS:  BP (!) 150/98 (BP Location: Left Arm, Patient Position:  Sitting, Cuff Size: Normal)   Pulse 94   Ht 4' 11.5" (1.511 m)   Wt 133 lb (60.3 kg)   BMI 26.41 kg/m?  , BMI Body mass index is 26.41 kg/m?. ?GENERAL:  Well appearing ?HEENT:  Pupils equal round and reactive, fundi not visualized, oral mucosa unremarkable ?NECK:  No jugular venous distention, waveform within normal limits, carotid upstroke brisk and symmetric, no bruits, no thyromegaly ?LYMPHATICS:  No cervical, inguinal adenopathy ?LUNGS:  Clear to auscultation bilaterally ?BACK:  No CVA tenderness ?CHEST:  Unremarkable ?HEART:  PMI not displaced or sustained,S1 and S2 within normal limits, no S3, no S4, no clicks, no rubs, no murmurs ?ABD:  Flat, positive bowel sounds normal in frequency in pitch, no bruits, no rebound, no guarding, no midline pulsatile mass, no hepatomegaly, no splenomegaly ?EXT:  2 plus pulses throughout, no edema, no cyanosis no clubbing ?SKIN:  No rashes no nodules ?NEURO:  Cranial nerves II through XII grossly intact, motor grossly intact throughout ?PSYCH:  Cognitively intact, oriented to person place and time ? ? ? ?EKG:  EKG is ordered today. ?The ekg ordered today demonstrates sinus rhythm, rate 94, leftward axis, no acute ST-T wave changes. ? ? ?Recent Labs: ?No results found for requested labs within last 8760 hours.  ? ? ?Lipid Panel ?No results found for: CHOL, TRIG, HDL, CHOLHDL, VLDL, LDLCALC, LDLDIRECT ?  ? ?Wt Readings from Last 3 Encounters:  ?04/19/21 133 lb (60.3 kg)  ?01/10/13 132 lb 4.8 oz (60 kg)  ?  ? ? ?Other studies Reviewed: ?Additional studies/ records that were reviewed today include: Primary care office records.Marland Kitchen ?Review of the above records demonstrates:  Please see elsewhere in the note.   ? ? ?ASSESSMENT AND PLAN: ? ?Elevated coronary calcium: She does have elevated coronary calcium.  This is above the 75th percentile.  I think it would be reasonable to screen her with a POET (Plain Old Exercise Treadmill).  This would also allow me to judge her blood pressure  response with exercise.   ? ?HTN: Her blood pressure is a little bit labile but actually better controlled over more recent readings.  She had her ARB doubled not long ago.  I think this is reasonable and I would suggest that she go ahead and start the antianxiety or depression treatment that has been prescribed as this likely is contributing to some labile BPs.  She has been having onto her prescription and has not been filled for Lexapro. ? ?Dyslipidemia she was started on Zetia.  She is on half a pill of Livalo as above.  Her LDL did come down although slightly above 100.  I think this was a reasonable response and I would continue on the meds as listed and repeat in about 4 months. ? ? ?Current medicines are reviewed at length with the patient today.  The patient does not have concerns regarding medicines. ? ?The following changes have been made:  no change ? ?Labs/ tests ordered today include:  ? ?Orders Placed This Encounter  ?Procedures  ? EXERCISE TOLERANCE TEST (ETT)  ? EKG 12-Lead  ? ? ? ?Disposition:   FU with me as needed based on the results of the above ? ? ?Signed, ?Minus Breeding, MD  ?04/19/2021 1:43 PM    ?[place] ? ? ? ?

## 2021-04-19 ENCOUNTER — Other Ambulatory Visit: Payer: Self-pay

## 2021-04-19 ENCOUNTER — Ambulatory Visit (INDEPENDENT_AMBULATORY_CARE_PROVIDER_SITE_OTHER): Payer: POS | Admitting: Cardiology

## 2021-04-19 ENCOUNTER — Encounter: Payer: Self-pay | Admitting: Cardiology

## 2021-04-19 VITALS — BP 150/98 | HR 94 | Ht 59.5 in | Wt 133.0 lb

## 2021-04-19 DIAGNOSIS — R931 Abnormal findings on diagnostic imaging of heart and coronary circulation: Secondary | ICD-10-CM | POA: Diagnosis not present

## 2021-04-19 NOTE — Patient Instructions (Addendum)
Medication Instructions:  ?Your physician recommends that you continue on your current medications as directed. Please refer to the Current Medication list given to you today. ? ?*If you need a refill on your cardiac medications before your next appointment, please call your pharmacy* ? ? ?Testing/Procedures: ?Your physician has requested that you have en exercise stress myoview. For further information please visit [email or web address] Please follow instruction sheet, as given. This procedure will be done at [address] [facility] ? ? ?Follow-Up: ?At [facility], you and your health needs are our priority.  As part of our continuing mission to provide you with exceptional heart care, we have created designated Provider Care Teams.  These Care Teams include your primary Cardiologist (physician) and Advanced Practice Providers (APPs -  Physician Assistants and Nurse Practitioners) who all work together to provide you with the care you need, when you need it. ? ?We recommend signing up for the patient portal called "MyChart".  Sign up information is provided on this After Visit Summary.  MyChart is used to connect with patients for Virtual Visits (Telemedicine).  Patients are able to view lab/test results, encounter notes, upcoming appointments, etc.  Non-urgent messages can be sent to your provider as well.   ?To learn more about what you can do with MyChart, go to [email or web address].   ? ?Your next appointment:   ?We will see you on an as needed basis. ? ?Provider:   ?Rollene Rotunda, MD ? ? ?Other Instructions ?HOW TO TAKE YOUR BLOOD PRESSURE: ?Rest 5 minutes before taking your blood pressure. ?Don?t smoke or drink caffeinated beverages for at least 30 minutes before. ?Take your blood pressure before (not after) you eat. ?Sit comfortably with your back supported and both feet on the floor (don?t cross your legs). ?Elevate your arm to heart level on a table or a desk. ?Use the proper sized cuff. It  should fit smoothly and snugly around your bare upper arm. There should be enough room to slip a fingertip under the cuff. The bottom edge of the cuff should be 1 inch above the crease of the elbow. ?Ideally, take 3 measurements at one sitting and record the average.  ? ?

## 2021-04-28 ENCOUNTER — Telehealth (HOSPITAL_COMMUNITY): Payer: Self-pay | Admitting: *Deleted

## 2021-04-28 NOTE — Telephone Encounter (Signed)
Close encounter 

## 2021-04-29 ENCOUNTER — Ambulatory Visit (HOSPITAL_COMMUNITY)
Admission: RE | Admit: 2021-04-29 | Discharge: 2021-04-29 | Disposition: A | Discharge status: Home / Self Care | Payer: POS | Source: Ambulatory Visit | Attending: Internal Medicine | Admitting: Internal Medicine

## 2021-04-29 DIAGNOSIS — R931 Abnormal findings on diagnostic imaging of heart and coronary circulation: Secondary | ICD-10-CM | POA: Diagnosis present

## 2021-04-29 LAB — EXERCISE TOLERANCE TEST
Angina Index: 0
Duke Treadmill Score: 6
Estimated workload: 7.2
Exercise duration (min): 6 min
Exercise duration (sec): 12 s
MPHR: 145 {beats}/min
Peak HR: 137 {beats}/min
Percent HR: 94 %
Rest HR: 78 {beats}/min
ST Depression (mm): 0 mm

## 2021-05-03 ENCOUNTER — Encounter: Payer: Self-pay | Admitting: *Deleted

## 2022-05-09 ENCOUNTER — Other Ambulatory Visit (HOSPITAL_COMMUNITY): Payer: Self-pay | Admitting: Registered Nurse

## 2022-05-09 DIAGNOSIS — R911 Solitary pulmonary nodule: Secondary | ICD-10-CM

## 2022-05-19 ENCOUNTER — Other Ambulatory Visit: Payer: Self-pay | Admitting: Registered Nurse

## 2022-05-19 DIAGNOSIS — R918 Other nonspecific abnormal finding of lung field: Secondary | ICD-10-CM

## 2022-05-27 ENCOUNTER — Other Ambulatory Visit: Payer: POS

## 2022-06-08 ENCOUNTER — Other Ambulatory Visit: Payer: Self-pay | Admitting: Registered Nurse

## 2022-06-08 DIAGNOSIS — R918 Other nonspecific abnormal finding of lung field: Secondary | ICD-10-CM

## 2022-06-24 ENCOUNTER — Ambulatory Visit
Admission: RE | Admit: 2022-06-24 | Discharge: 2022-06-24 | Disposition: A | Discharge status: Home / Self Care | Payer: No Typology Code available for payment source | Source: Ambulatory Visit | Attending: Registered Nurse | Admitting: Registered Nurse

## 2022-06-24 DIAGNOSIS — R918 Other nonspecific abnormal finding of lung field: Secondary | ICD-10-CM

## 2022-06-29 ENCOUNTER — Other Ambulatory Visit: Payer: POS

## 2023-01-15 IMAGING — CT CT CARDIAC CORONARY ARTERY CALCIUM SCORE
3 series · 14 of 20 positions shown, 16 images · non-contrast
Comparison: None.

CLINICAL DATA: 75-year-old Caucasian female with history of
hyperlipidemia, hypertension and prior smoking history.

EXAM:
CT CARDIAC CORONARY ARTERY CALCIUM SCORE
TECHNIQUE: Non-contrast imaging through the heart was performed using
prospective ECG gating. Image post processing was performed on an
independent workstation, allowing for quantitative analysis of the
heart and coronary arteries. Note that this exam targets the heart
and the chest was not imaged in its entirety.

[Series 2: calcium scoring 2.00 qr36 bestdiast 71% hrt calciu · axial · 0.35mm/px · z∈[+1563,+1645]mm · 4 of 69 slices shown]
[im 14/69  vessel]
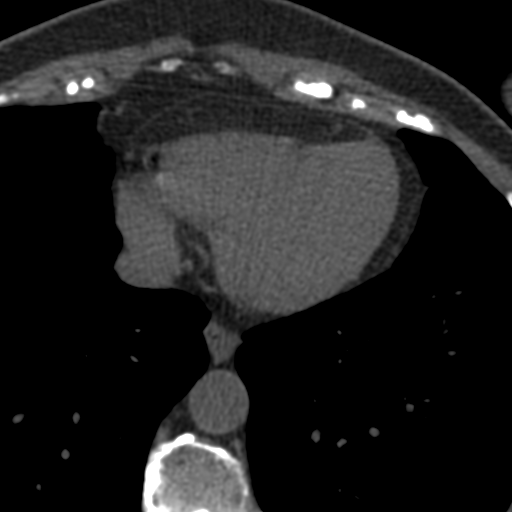
[im 28/69  vessel]
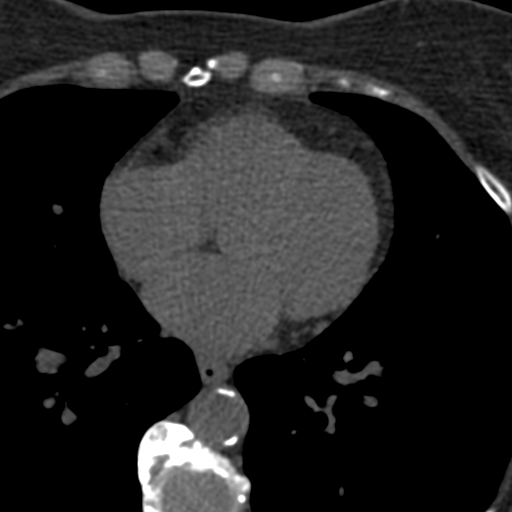
[im 41/69  vessel]
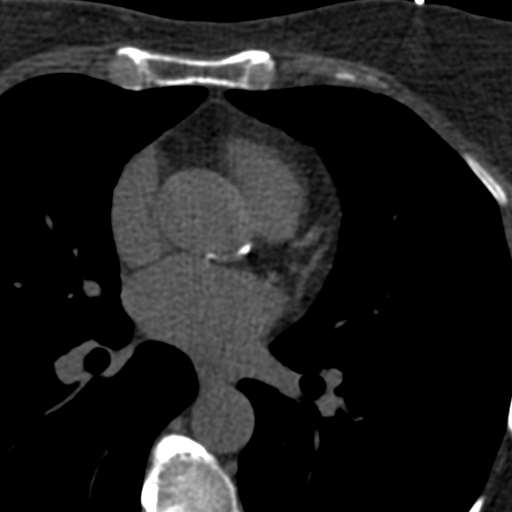
[im 55/69  vessel]
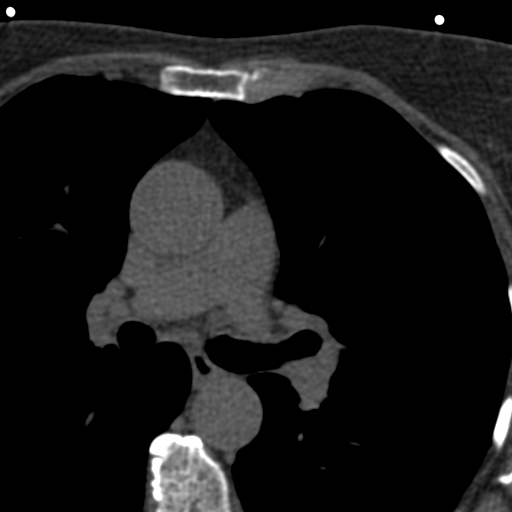

[Series 3: calcium scoring 2.00 br40 bestdiast 71% axial · axial · 0.54mm/px · z∈[+1559,+1649]mm · 5 of 69 slices shown, 7 images]
[im 12/69  vessel]
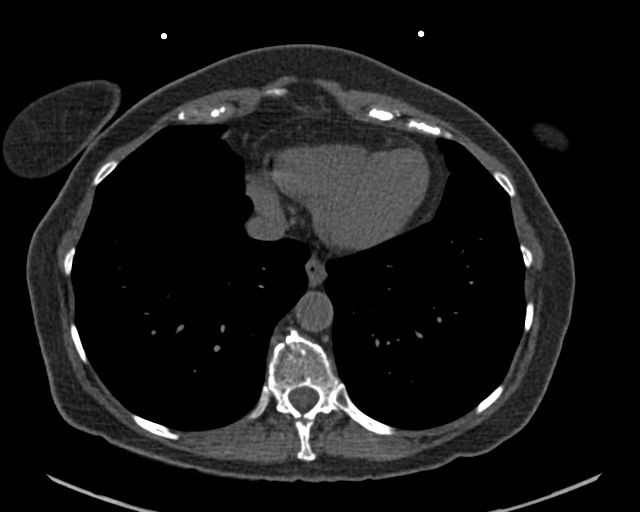
[im 12/69  lung]
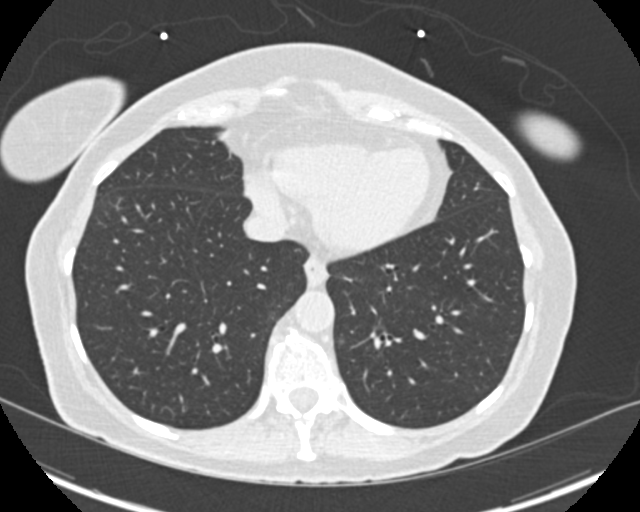
[im 23/69  vessel]
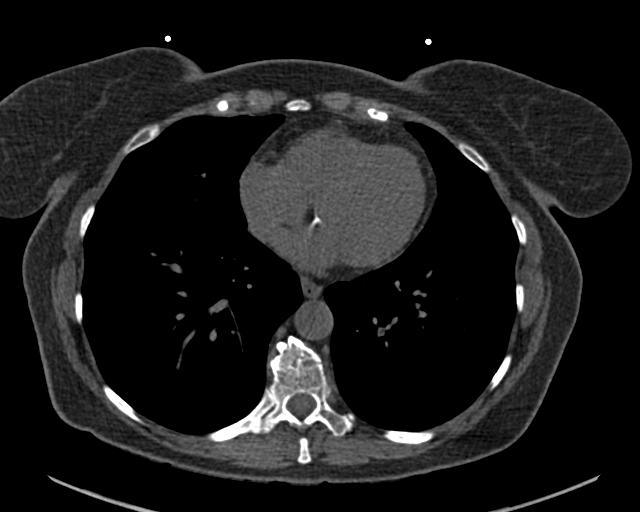
[im 35/69  vessel]
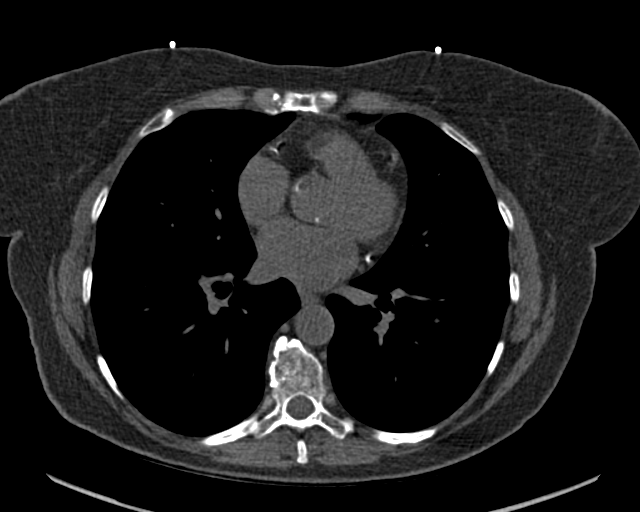
[im 46/69  vessel]
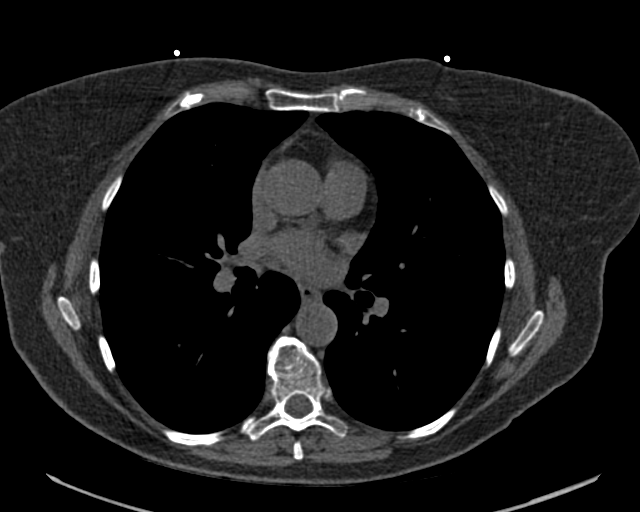
[im 57/69  vessel]
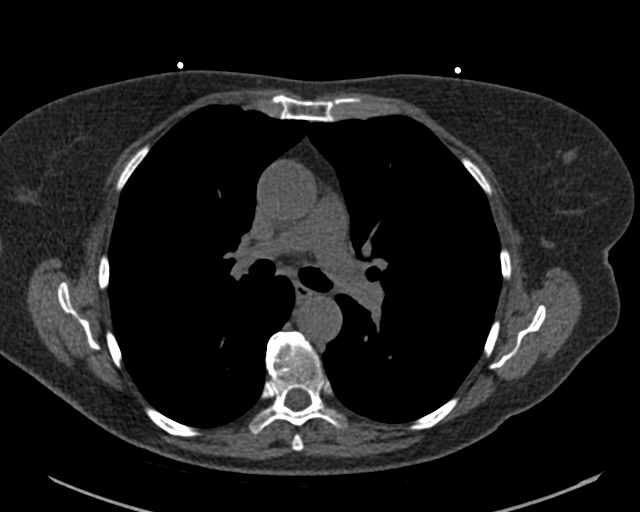
[im 57/69  lung]
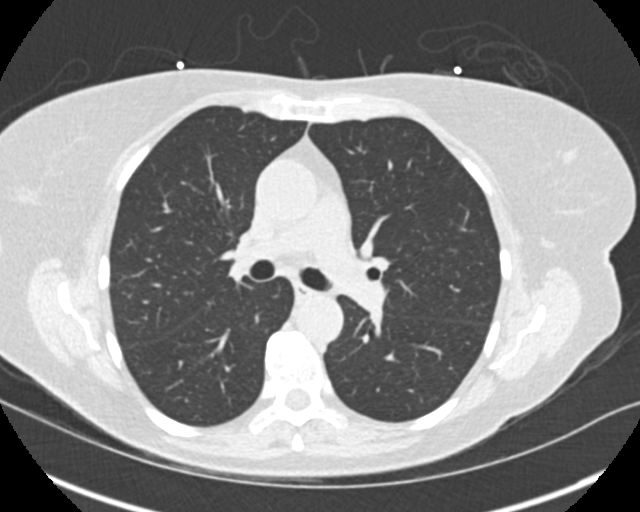

[Series 9: calcium scoring 2.00 br60 bestdiast 71% lungs · axial · 0.54mm/px · z∈[+1559,+1649]mm · 5 of 69 slices shown]
[im 12/69  vessel]
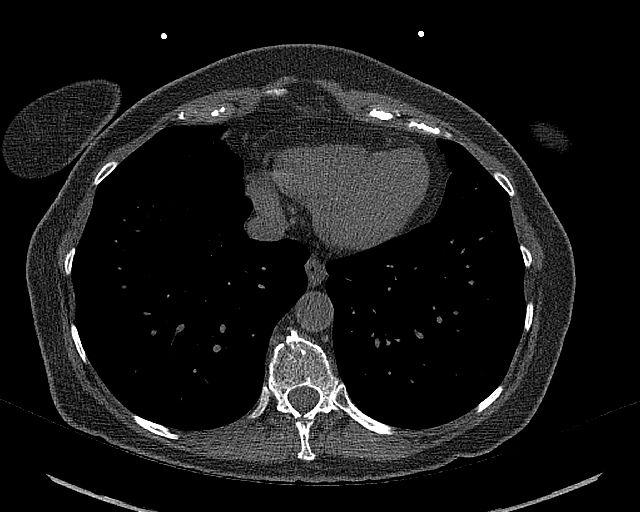
[im 23/69  vessel]
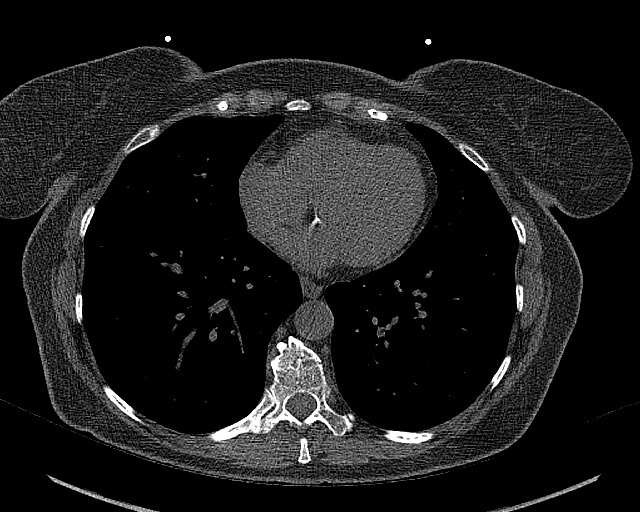
[im 35/69  vessel]
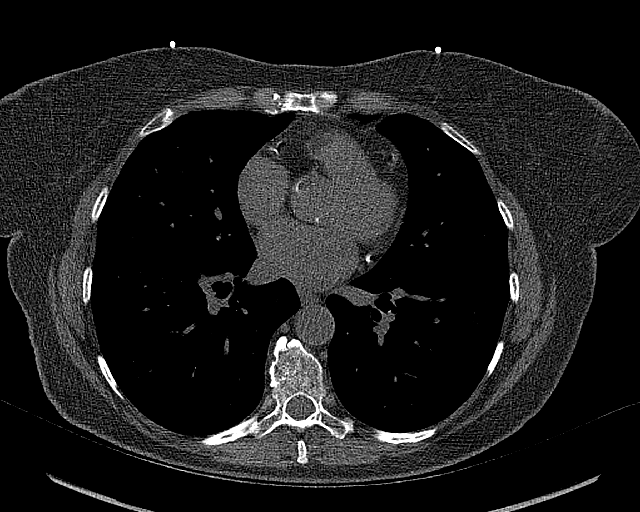
[im 46/69  vessel]
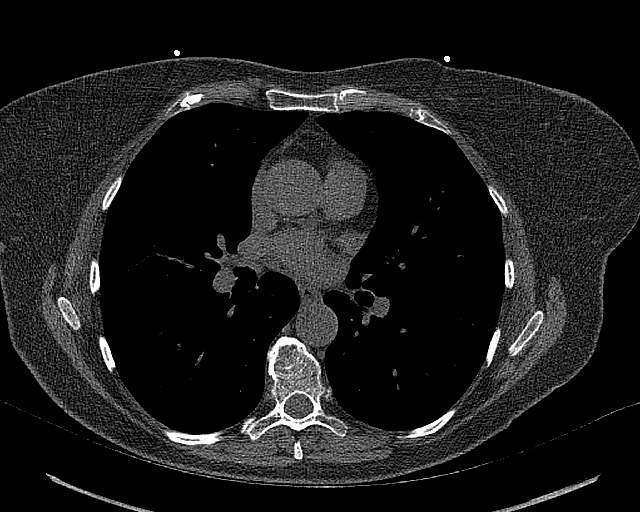
[im 57/69  vessel]
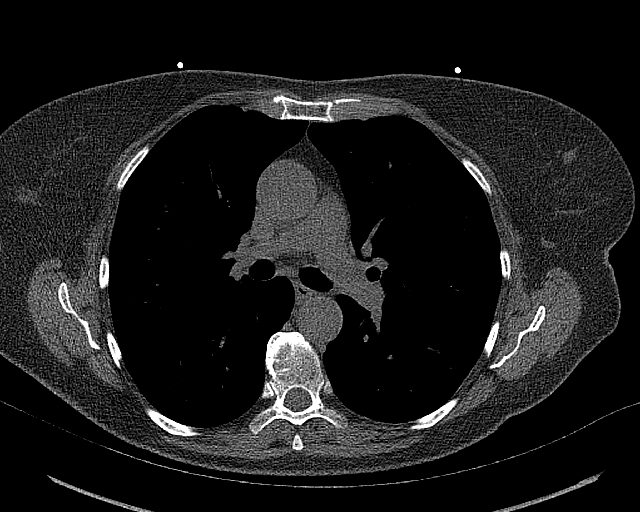

[14 of 20 positions shown; findings below may reference images not displayed]

FINDINGS: CORONARY CALCIUM SCORES:

Left Main: 0

LAD:

LCx:

RCA: 144

Total Agatston Score: 240

[HOSPITAL] percentile: 75

AORTA MEASUREMENTS:

Ascending Aorta: 28 mm

Descending Aorta: 22 mm

OTHER FINDINGS:

The heart size is within normal limits. There are calcifications at
the level of the aortic root, descending thoracic aorta and mitral
valve annulus. No pericardial fluid is identified. Visualized
segments of the thoracic aorta and central pulmonary arteries are
normal in caliber. Visualized mediastinum and hilar regions
demonstrate no lymphadenopathy or masses. There is a 1.5 mm
calcified granuloma in the subpleural lingula on image 38 and
additional 1.3 mm calcified granuloma in the left lower lobe on
image 32. Vague 3 mm nodularity in the anterior and inferior aspect
of the right upper lobe on image 24. 2 mm peripheral nodule in the
right middle lobe on image 39. 1.6 mm nodule in the anterior
subpleural right middle lobe on image 34. Visualized lungs show no
evidence of pulmonary edema, consolidation, pneumothorax or pleural
fluid. Visualized upper abdomen and bony structures are
unremarkable.
IMPRESSION: 1. Coronary calcium score of 240 is at the 75th percentile for the
patient's age, sex and race.
2. Atherosclerosis of the thoracic aorta.
3. Calcified mitral valve annulus.
4. Multiple tiny bilateral pulmonary nodules, some of which are
calcified granulomata. Nodules are most likely postinflammatory in
nature. No follow-up needed if patient is low-risk (and has no known
or suspected primary neoplasm). Non-contrast chest CT can be
considered in 12 months if patient is high-risk. This recommendation
follows the consensus statement: Guidelines for Management of
Incidental Pulmonary Nodules Detected on CT Images: From the

## 2023-01-15 IMAGING — US US CAROTID DUPLEX BILAT
1 series · 13 of 24 positions shown · non-contrast
Comparison: None.

CLINICAL DATA: 75-year-old female with a history of atherosclerosis

EXAM:
BILATERAL CAROTID DUPLEX ULTRASOUND
TECHNIQUE: Gray scale imaging, color Doppler and duplex ultrasound were
performed of bilateral carotid and vertebral arteries in the neck.

[Series 1: us carotid duplex bilat · 0.07mm/px · 13 of 52 slices shown]
[im 1/52]
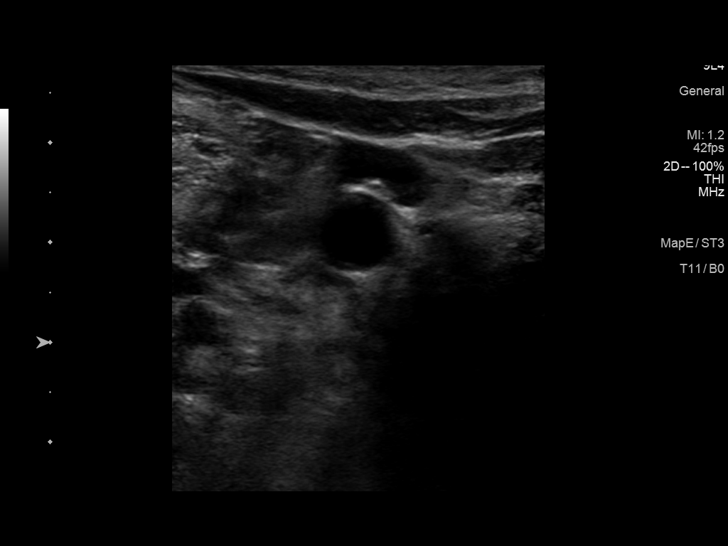
[im 5/52]
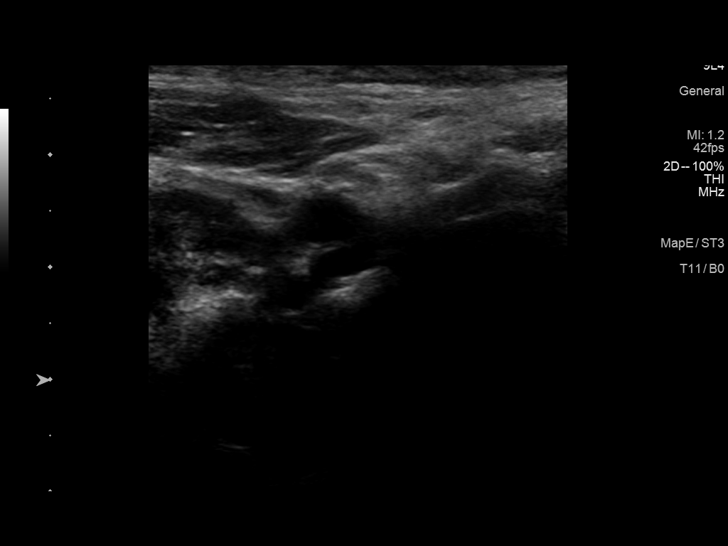
[im 9/52]
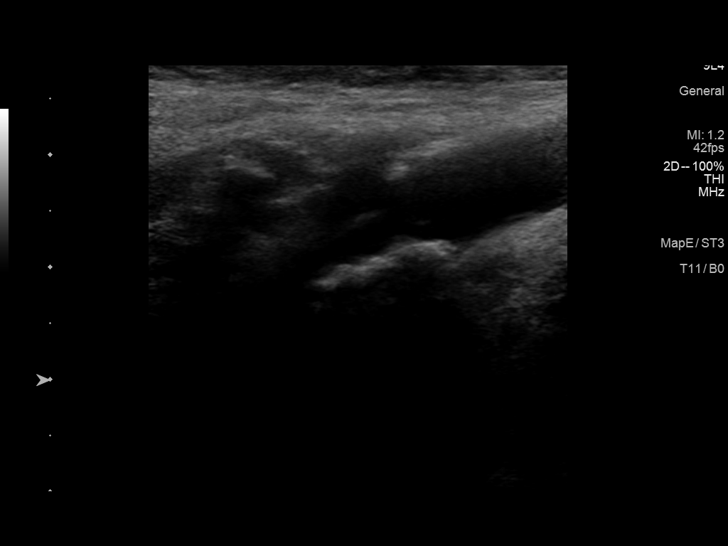
[im 14/52]
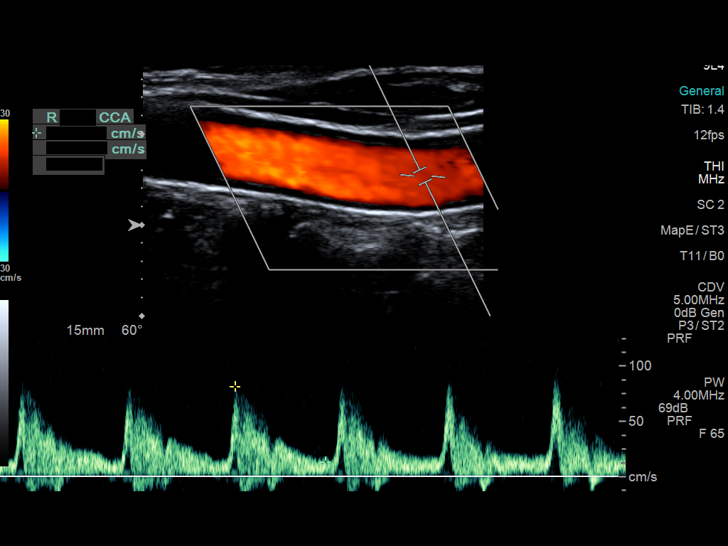
[im 18/52]
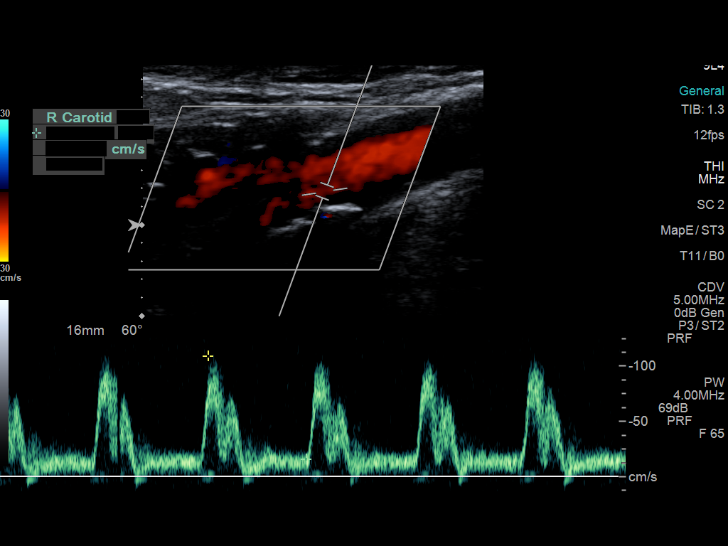
[im 23/52]
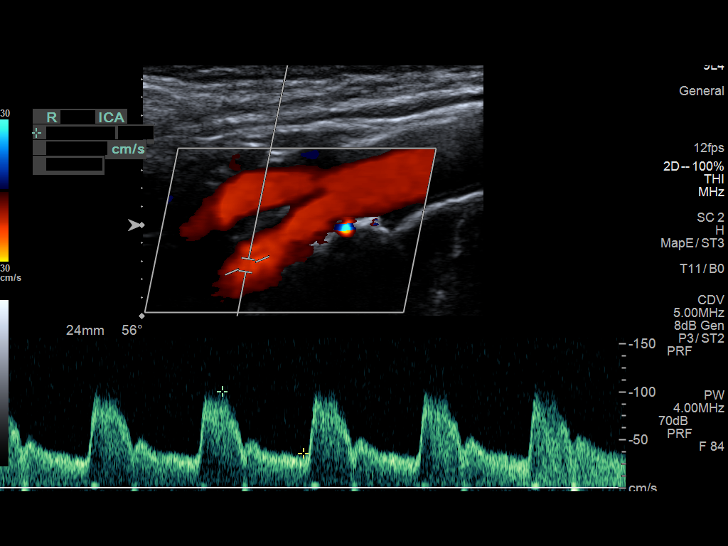
[im 27/52]
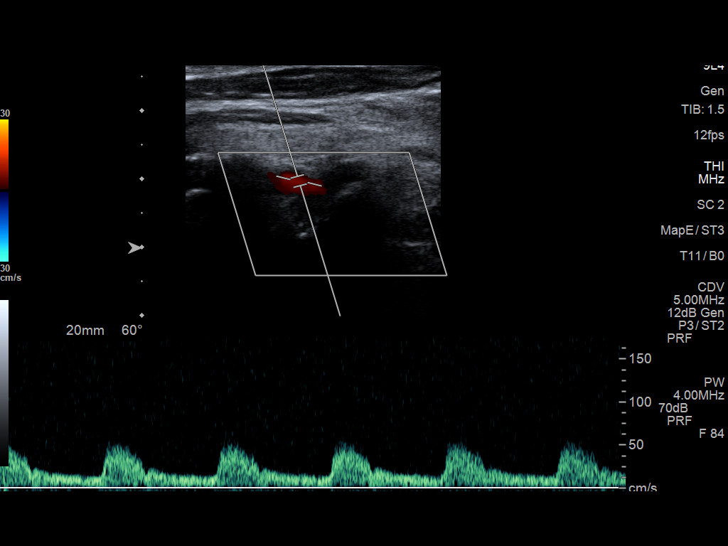
[im 29/52]
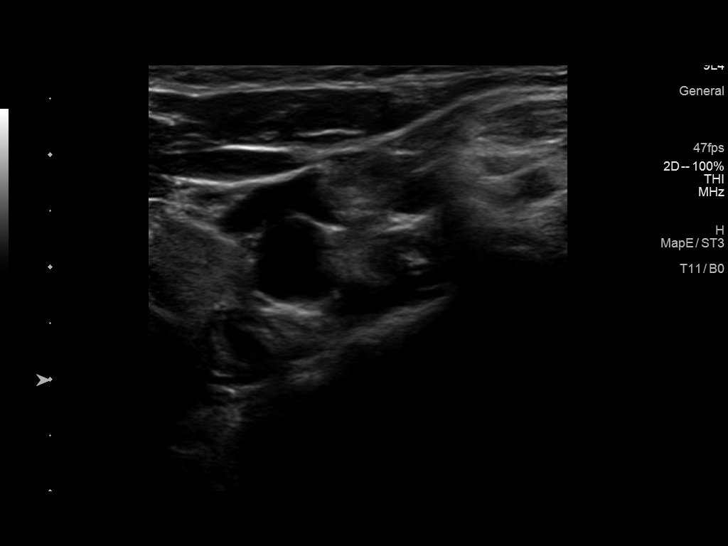
[im 34/52]
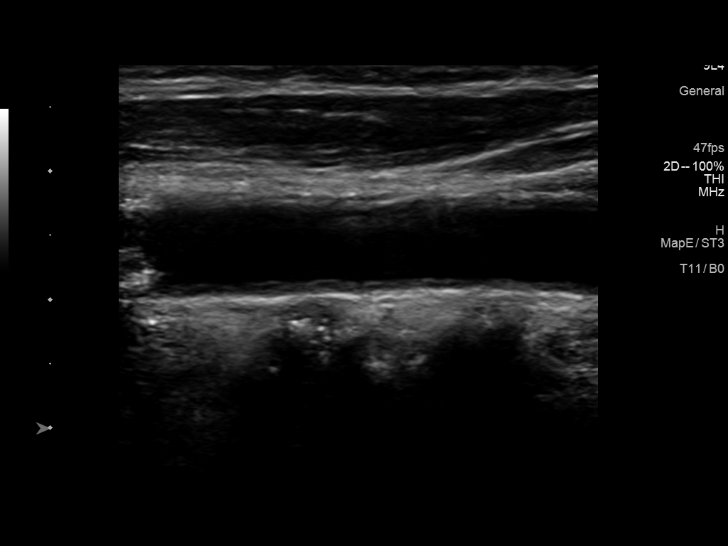
[im 38/52]
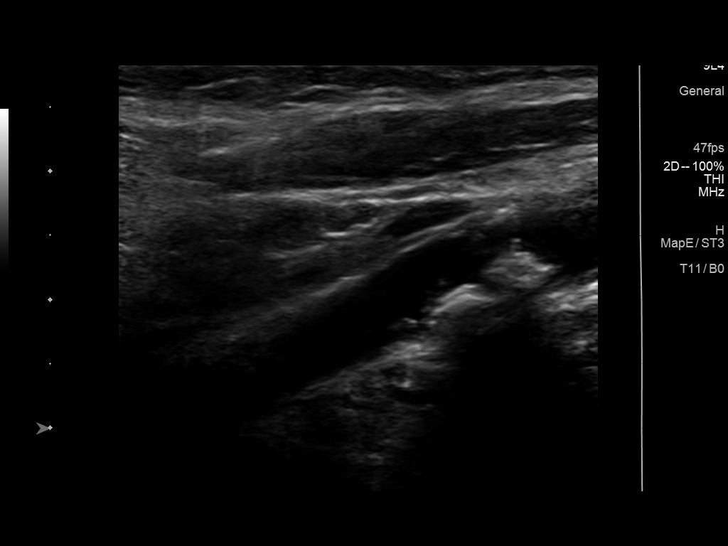
[im 43/52]
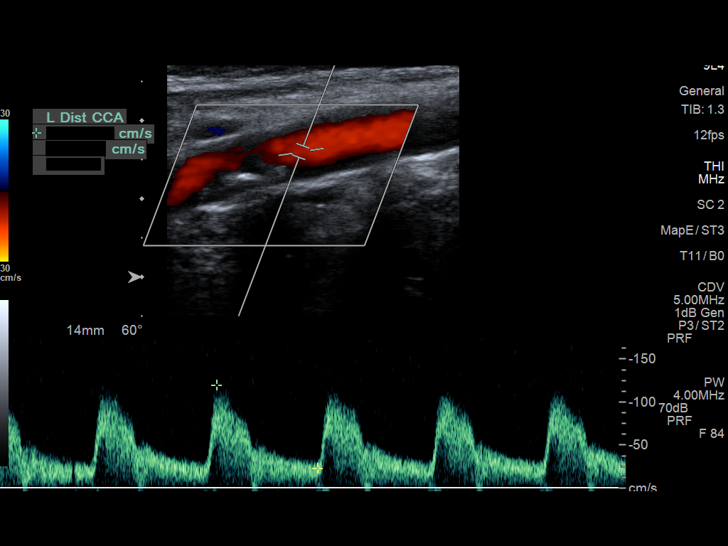
[im 47/52]
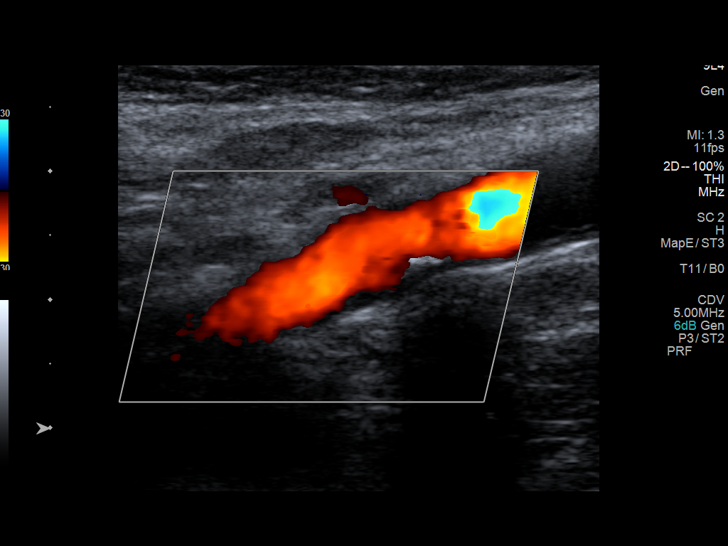
[im 52/52]
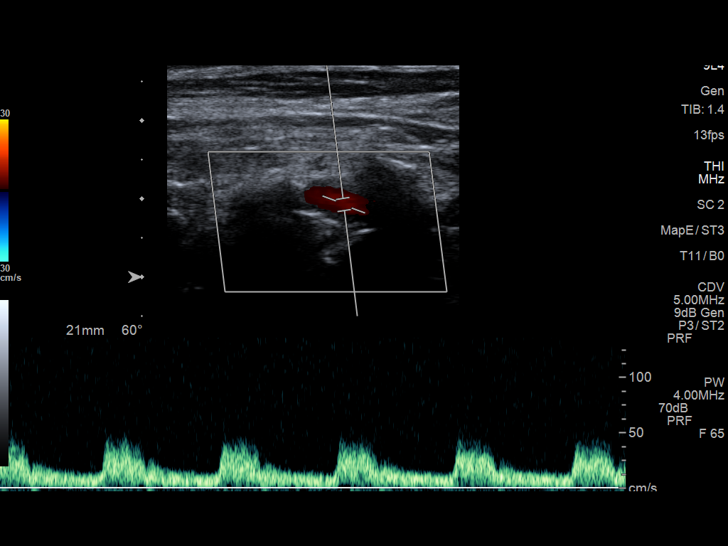

[13 of 24 positions shown; findings below may reference images not displayed]

FINDINGS: Criteria: Quantification of carotid stenosis is based on velocity
parameters that correlate the residual internal carotid diameter
with NASCET-based stenosis levels, using the diameter of the distal
internal carotid lumen as the denominator for stenosis measurement.

The following velocity measurements were obtained:

RIGHT

ICA:  Systolic 100 cm/sec, Diastolic 36 cm/sec

CCA:  97 cm/sec

SYSTOLIC ICA/CCA RATIO:

ECA:  174 cm/sec

LEFT

ICA:  Systolic 112 cm/sec, Diastolic 28 cm/sec

CCA:  123 cm/sec

SYSTOLIC ICA/CCA RATIO:

ECA:  162 cm/sec

Right Brachial SBP: 176

Left Brachial SBP: 201

RIGHT CAROTID ARTERY: No significant calcifications of the right
common carotid artery. Intermediate waveform maintained. Moderate
heterogeneous and partially calcified plaque at the right carotid
bifurcation. Some lumen shadowing. Low resistance waveform of the
right ICA. No significant tortuosity.

RIGHT VERTEBRAL ARTERY: Antegrade flow with low resistance waveform.

LEFT CAROTID ARTERY: No significant calcifications of the left
common carotid artery. Intermediate waveform maintained. Moderate
heterogeneous and partially calcified plaque at the left carotid
bifurcation. Some lumen shadowing. Low resistance waveform of the
left ICA. No significant tortuosity.

LEFT VERTEBRAL ARTERY:  Antegrade flow with low resistance waveform.
IMPRESSION: Color duplex indicates moderate heterogeneous and calcified plaque,
with no hemodynamically significant stenosis by duplex criteria in
the extracranial cerebrovascular circulation.

## 2023-04-24 LAB — LAB REPORT - SCANNED: EGFR: 73
